# Patient Record
Sex: Female | Born: 1980 | Race: White | Hispanic: No | Marital: Married | Smoking: Never smoker
Health system: Southern US, Community
[De-identification: ages and names within clinical notes are randomized; demographics above are authoritative.]

## PROBLEM LIST (undated history)

## (undated) DIAGNOSIS — B009 Herpesviral infection, unspecified: Secondary | ICD-10-CM

## (undated) DIAGNOSIS — G43909 Migraine, unspecified, not intractable, without status migrainosus: Secondary | ICD-10-CM

## (undated) DIAGNOSIS — G40909 Epilepsy, unspecified, not intractable, without status epilepticus: Secondary | ICD-10-CM

## (undated) HISTORY — DX: Epilepsy, unspecified, not intractable, without status epilepticus: G40.909

## (undated) HISTORY — PX: NO PAST SURGERIES: SHX2092

## (undated) HISTORY — DX: Herpesviral infection, unspecified: B00.9

## (undated) HISTORY — DX: Migraine, unspecified, not intractable, without status migrainosus: G43.909

---
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
## 2001-09-12 ENCOUNTER — Other Ambulatory Visit: Admission: RE | Admit: 2001-09-12 | Discharge: 2001-09-12 | Payer: Self-pay | Admitting: Gynecology

## 2002-10-18 ENCOUNTER — Other Ambulatory Visit: Admission: RE | Admit: 2002-10-18 | Discharge: 2002-10-18 | Payer: Self-pay | Admitting: Gynecology

## 2003-10-24 ENCOUNTER — Other Ambulatory Visit: Admission: RE | Admit: 2003-10-24 | Discharge: 2003-10-24 | Payer: Self-pay | Admitting: Gynecology

## 2005-01-19 ENCOUNTER — Other Ambulatory Visit: Admission: RE | Admit: 2005-01-19 | Discharge: 2005-01-19 | Payer: Self-pay | Admitting: Gynecology

## 2006-01-30 ENCOUNTER — Other Ambulatory Visit: Admission: RE | Admit: 2006-01-30 | Discharge: 2006-01-30 | Payer: Self-pay | Admitting: Gynecology

## 2007-02-08 ENCOUNTER — Other Ambulatory Visit: Admission: RE | Admit: 2007-02-08 | Discharge: 2007-02-08 | Payer: Self-pay | Admitting: Obstetrics and Gynecology

## 2007-09-03 ENCOUNTER — Other Ambulatory Visit: Admission: RE | Admit: 2007-09-03 | Discharge: 2007-09-03 | Payer: Self-pay | Admitting: Gynecology

## 2007-12-23 ENCOUNTER — Observation Stay (HOSPITAL_COMMUNITY): Admission: EM | Admit: 2007-12-23 | Discharge: 2007-12-24 | Payer: Self-pay | Admitting: Emergency Medicine

## 2007-12-23 ENCOUNTER — Ambulatory Visit: Payer: Self-pay | Admitting: Infectious Diseases

## 2008-03-13 ENCOUNTER — Encounter: Payer: Self-pay | Admitting: Women's Health

## 2008-03-13 ENCOUNTER — Other Ambulatory Visit: Admission: RE | Admit: 2008-03-13 | Discharge: 2008-03-13 | Payer: Self-pay | Admitting: Gynecology

## 2008-03-13 ENCOUNTER — Ambulatory Visit: Payer: Self-pay | Admitting: Women's Health

## 2008-09-27 ENCOUNTER — Inpatient Hospital Stay (HOSPITAL_COMMUNITY): Admission: EM | Admit: 2008-09-27 | Discharge: 2008-09-28 | Payer: Self-pay | Admitting: Emergency Medicine

## 2009-03-18 ENCOUNTER — Other Ambulatory Visit: Admission: RE | Admit: 2009-03-18 | Discharge: 2009-03-18 | Payer: Self-pay | Admitting: Gynecology

## 2009-03-18 ENCOUNTER — Ambulatory Visit: Payer: Self-pay | Admitting: Women's Health

## 2009-12-24 ENCOUNTER — Ambulatory Visit: Payer: Self-pay | Admitting: Women's Health

## 2010-03-19 ENCOUNTER — Ambulatory Visit: Payer: Self-pay | Admitting: Women's Health

## 2010-03-19 ENCOUNTER — Other Ambulatory Visit: Admission: RE | Admit: 2010-03-19 | Discharge: 2010-03-19 | Payer: Self-pay | Admitting: Gynecology

## 2010-05-15 IMAGING — CR DG ELBOW COMPLETE 3+V*L*
4 series · 4 of 4 positions shown · non-contrast
Comparison: None

CLINICAL DATA: Seizure.  Trauma.

LEFT ELBOW - COMPLETE 3+ VIEW

[x elbow joint ap left]
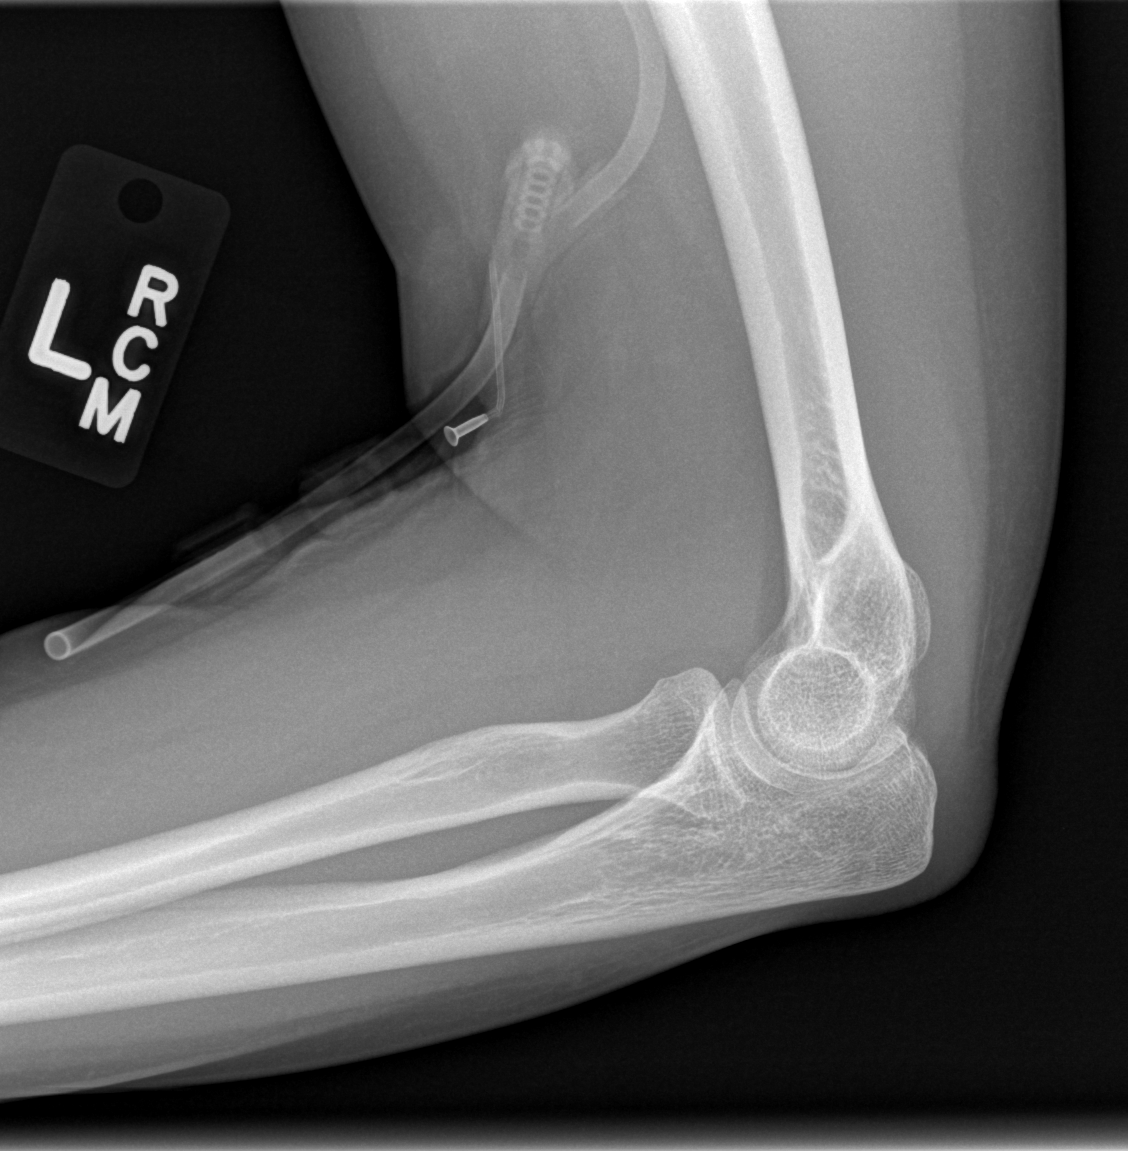

[x elbow joint obl. left (1 of 2)]
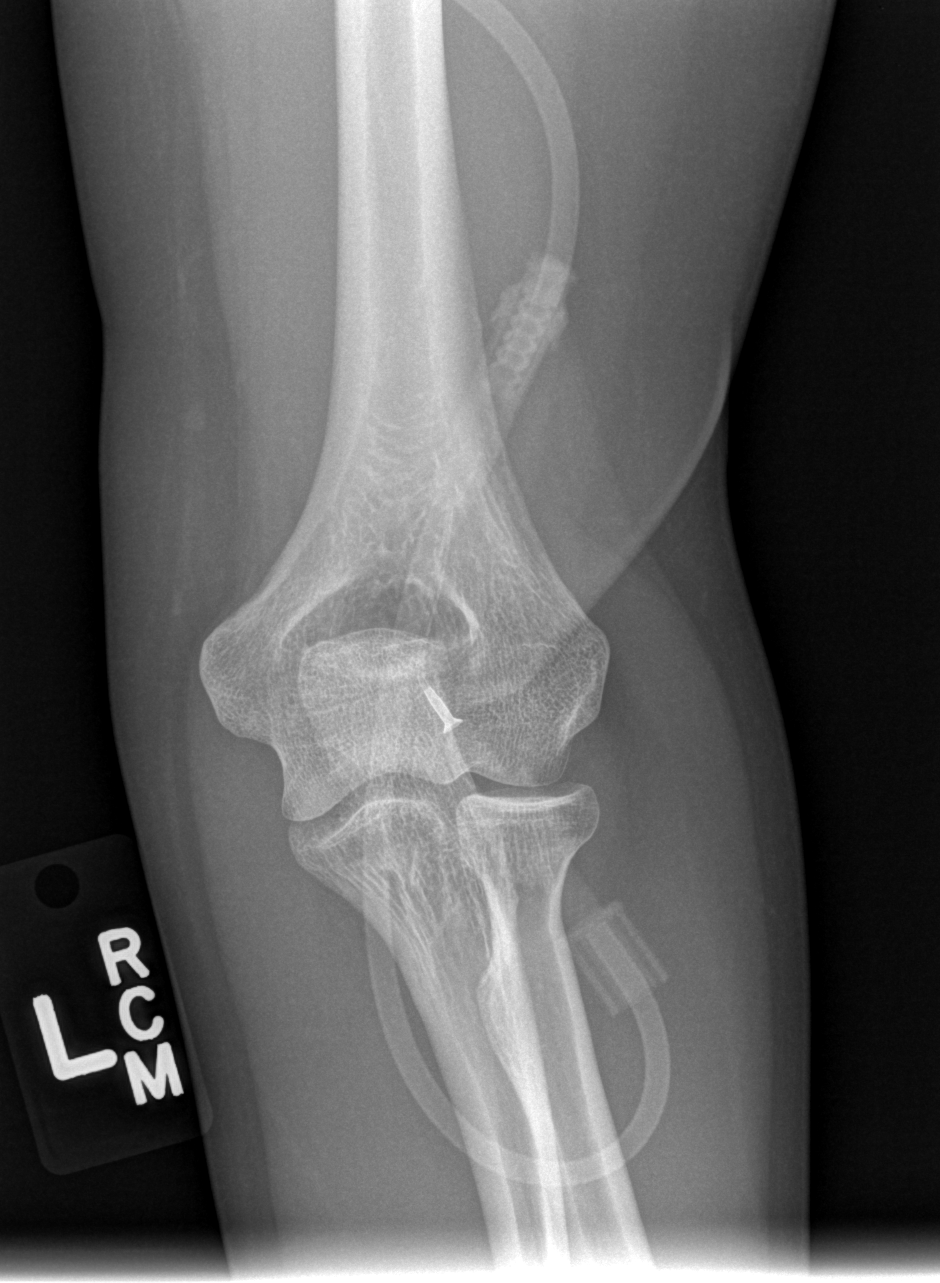

[x elbow joint obl. left (2 of 2)]
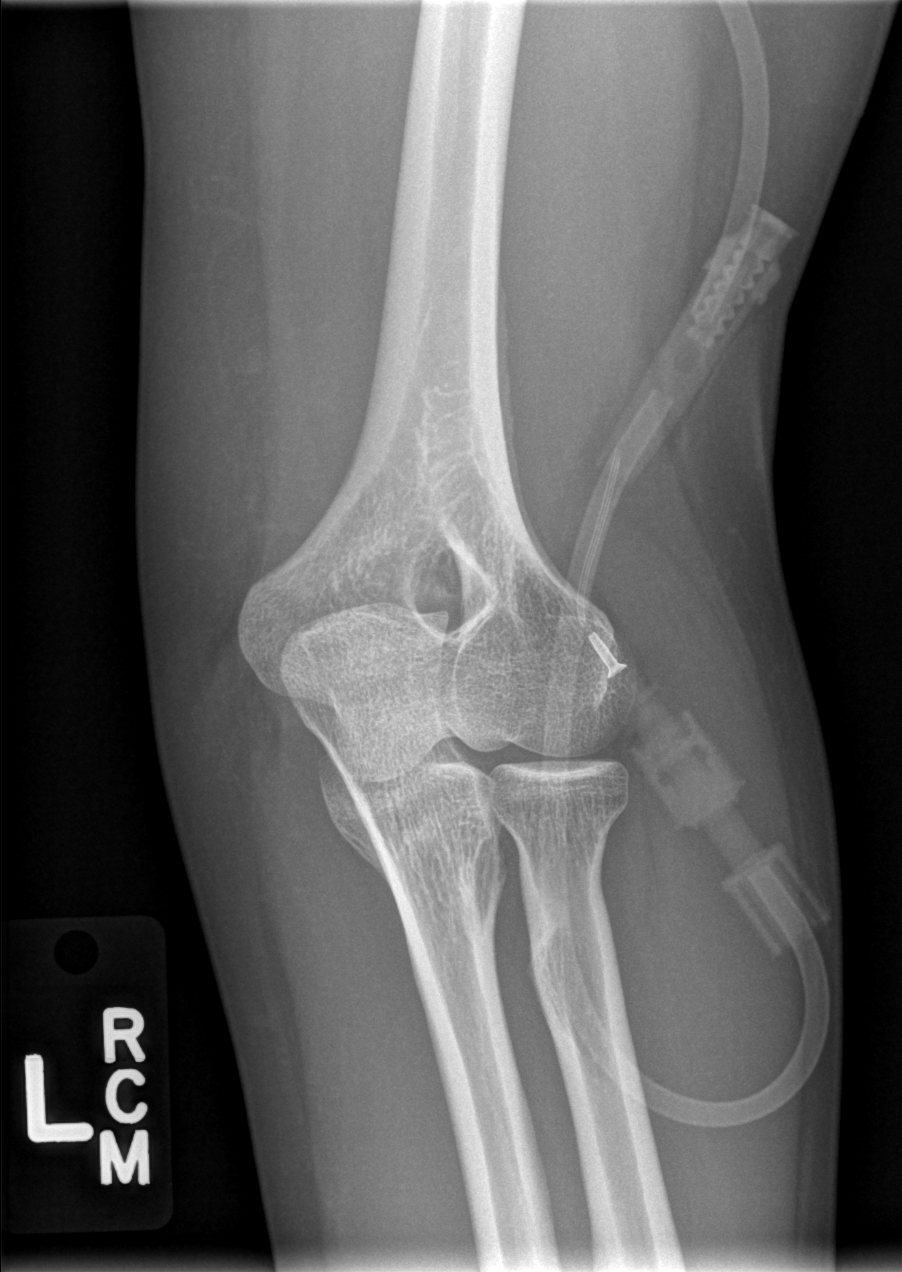

[x elbow joint lat left]
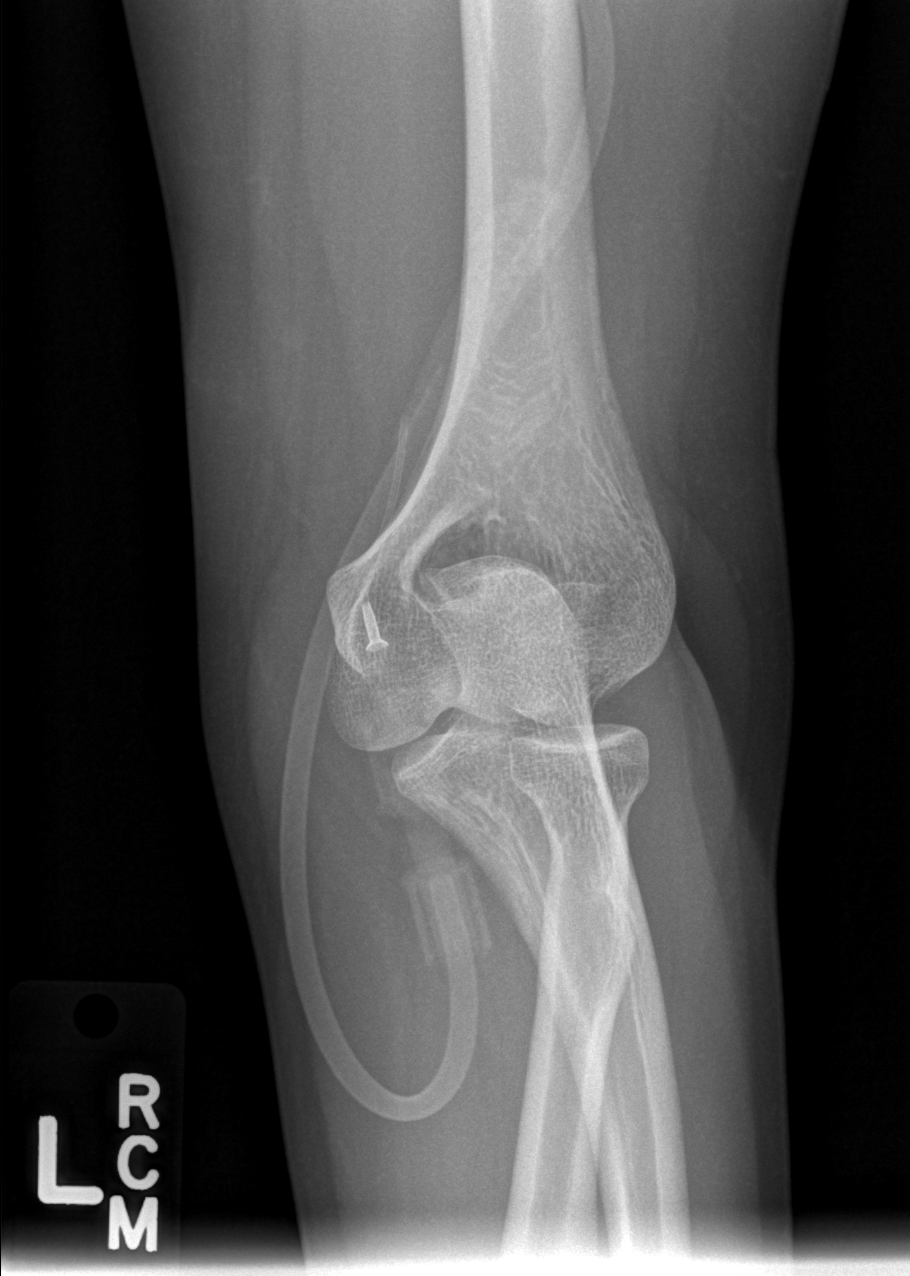

[4 of 4 positions shown; findings below may reference images not displayed]

FINDINGS: Artifact overlies the region.  No discernible joint
effusion.  No evidence of fracture or dislocation.
IMPRESSION: Negative

## 2010-06-09 ENCOUNTER — Ambulatory Visit: Payer: 59 | Admitting: Women's Health

## 2010-06-09 DIAGNOSIS — R21 Rash and other nonspecific skin eruption: Secondary | ICD-10-CM

## 2010-08-10 LAB — COMPREHENSIVE METABOLIC PANEL
Alkaline Phosphatase: 54 U/L (ref 39–117)
BUN: 8 mg/dL (ref 6–23)
CO2: 22 mEq/L (ref 19–32)
Calcium: 8.7 mg/dL (ref 8.4–10.5)
GFR calc non Af Amer: >60 mL/min (ref >60)
Glucose, Bld: 93 mg/dL (ref 70–99)
Potassium: 3.5 mEq/L (ref 3.5–5.1)
Total Protein: 6.8 g/dL (ref 6.0–8.3)

## 2010-08-10 LAB — URINALYSIS, ROUTINE W REFLEX MICROSCOPIC
Bilirubin Urine: NEGATIVE
Specific Gravity, Urine: 1.024 (ref 1.005–1.030)
Urobilinogen, UA: 0.2 mg/dL (ref 0.0–1.0)
pH: 7 (ref 5.0–8.0)

## 2010-08-10 LAB — POCT I-STAT, CHEM 8
Creatinine, Ser: 0.9 mg/dL (ref 0.4–1.2)
Glucose, Bld: 86 mg/dL (ref 70–99)
Hemoglobin: 14.3 g/dL (ref 12.0–15.0)
TCO2: 18 mmol/L (ref 0–100)

## 2010-08-10 LAB — CBC
HCT: 40.3 % (ref 36.0–46.0)
Hemoglobin: 13.8 g/dL (ref 12.0–15.0)
MCHC: 34.1 g/dL (ref 30.0–36.0)
MCV: 88.8 fL (ref 78.0–100.0)
RBC: 4.54 MIL/uL (ref 3.87–5.11)

## 2010-08-10 LAB — URINE MICROSCOPIC-ADD ON

## 2010-08-10 LAB — RAPID URINE DRUG SCREEN, HOSP PERFORMED
Amphetamines: NOT DETECTED
Barbiturates: NOT DETECTED
Opiates: NOT DETECTED

## 2010-08-13 ENCOUNTER — Other Ambulatory Visit: Payer: Self-pay | Admitting: Dermatology

## 2010-08-23 ENCOUNTER — Other Ambulatory Visit: Payer: Self-pay | Admitting: Dermatology

## 2010-09-14 NOTE — Procedures (Signed)
EEG NUMBER:   CLINICAL HISTORY:  A 30 year old female, has witnessed seizure activity  x2, following stay up late, heavy drinking, and the patient had a  history of absence seizure at age 62.   TECHNICAL COMMENT:  An 18-channel EEG was performed based on standard  international 10-20 system.  Upon awakening, the posterior background  activity was 10 Hz, reactive to eye opening and closure, symmetric.  During the drowsiness, there was burst of biosynchronized,  intermittent,  delta activity, short lasting, couple of seconds but did  not proceed, and followed by background slowing.  It has increased  appearings, during the photic stimulation, and during hyperventilation.  There was no evidence of epileptiform discharge.   The patient was drowsy during the recording but no deeper stage of sleep  was achieved.   CONCLUSION:  This is a mild abnormal EEG.  There is no evidence of  epileptiform discharge.  The synchronized, intermittent delta activity,  can be a normal variant or indicate a mild encephalopathic process.      Levert Feinstein, MD  Electronically Signed     [id number]  D:  12/24/2007 13:58:13  T:  12/25/2007 04:10:58  Job #:  [id number]

## 2010-09-14 NOTE — H&P (Signed)
NAME:  Lori Alvarado, Lori Alvarado NO.:  [id number]   MEDICAL RECORD NO.:  [id number]          PATIENT TYPE:  INP   LOCATION:  3729                         FACILITY:  [facility]   PHYSICIAN:  Noel Christmas, MD    DATE OF BIRTH:  [birth year]   DATE OF ADMISSION:  09/27/2008  DATE OF DISCHARGE:                              HISTORY & PHYSICAL   HISTORY OF PRESENT ILLNESS:  This is a 30 year old lady with a history  of generalized seizure disorder, presenting with history of 2 grand mal  seizures today which were witnessed.  First seizure occurred at home.  The patient fell and suffered bump to the back of her head as well as  mild injury to elbow.  Second seizure occurred while in the emergency  room.  She has been on Topamax 100 mg at bedtime.  Her last seizure  activity was in August 2009.  Seizure activity occurred after night of  consuming larger than usual amount of alcohol as well as lack of  adequate sleep.  She presented at this time for similar circumstances  having drank at least moderate if not large amount of alcohol last night  and having only 4-5 hours of sleep.  Her CT scan showed no intracranial  abnormality.  CBC was normal.  She was afebrile.  Complete metabolic  panel and drug screens are pending at this point.  Pregnancy test was  negative.   PAST MEDICAL HISTORY:  Remarkable for generalized seizures over the past  2 years.  She has a remote history of absence seizures during childhood.  She was seizure free for about 16 years.  Medical history is otherwise  remarkable for migraine headaches.   MEDICATIONS:  1. Topamax 100 mg at bedtime.  2. Birth control pills.   FAMILY HISTORY:  Positive for epilepsy involving a maternal aunt.  Family history is otherwise noncontributory.   ALLERGIES:  No known drug allergies.   SOCIAL HISTORY:  The patient is single.  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  She drinks beer mostly on  weekends.  She is a nonsmoker.   REVIEW OF SYSTEMS:  Negative except for presenting problem.   PHYSICAL EXAMINATION:  VITAL SIGNS:  Temperature was 98.6, pulse 86 per  minute, respirations 16 per minute, blood pressure was 125/79, and  oxygen saturation was 100% on room air.  GENERAL:  The appearance was that of a young lady of medium built who  was alert, cooperative and in no acute distress.  Except for slight  disorientation to day and date, her mental status was normal.  HEAD, EYES, EARS, NOSE, AND THROAT:  Normal.  NECK:  Supple and nontender.  CHEST:  Clear to auscultation.  HEART:  Heart sounds were normal.  Rate and rhythm were normal as well.  ABDOMEN:  Soft and flat.  Bowel sounds were normal.  There was no  tenderness.  EXTREMITIES:  Normal including peripheral pulses.  NEUROLOGIC:  Pupils were equal and reactive normally to light.  Extraocular movements and visual fields were normal.  There was no  facial weakness.  Hearing was normal.  Speech and palatal movement were  normal.  Strength and muscle tone were normal throughout.  Deep tendon  reflexes were normal and symmetrical.  Plantar responses were flexor.  Sensory examination was normal.  Coordination was normal.  Carotid  auscultation was normal.   CLINICAL IMPRESSION:  1. Recurrent grand mal seizures probably to at least in part to sleep      deprivation as well as alcohol consumption and possible withdrawal.  2. 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   PLAN:  1. IV Keppra 500 mg loading dose.  2. Keppra 500 mg p.o. q.12 h. over the next 24 hours.  3. We will increase Topamax to 100 mg q.a.m. and at bedtime.  4. No driving of motor vehicle until cleared by Dr. Anne Hahn.  5. Follow up with Dr. Anne Hahn after discharge.  6. No alcohol consumption except in very small amounts.  7. Minimum 7-8 hours of sleep each night.      Noel Christmas, MD  Electronically Signed     CS/MEDQ  D:  09/27/2008  T:  09/28/2008   Job:  [id number]

## 2010-09-14 NOTE — Discharge Summary (Signed)
NAMECOLEEN, CARDIFF NO.:  [id number]   MEDICAL RECORD NO.:  [id number]          PATIENT TYPE:  INP   LOCATION:  3729                         FACILITY:  [facility]   PHYSICIAN:  Noel Christmas, MD    DATE OF BIRTH:  19-Sep-1980   DATE OF ADMISSION:  09/27/2008  DATE OF DISCHARGE:  09/28/2008                               DISCHARGE SUMMARY   DISCHARGE DIAGNOSES:  1. Recurrent generalized seizure activity.  2. Chronic generalized seizure disorder.  3. Migraine headaches.   HISTORY OF PRESENT ILLNESS:  This is a 30 year old lady who was admitted  yesterday following 2 grand mal seizures, the first of which occurred at  home and the second occurred in the emergency room.  She had been on  Topamax 100 mg at bedtime.  The patient had drunk a larger than usual  amount of alcohol on the previous night as well as not gotten usual  amount of sleep and was sleep deprived.  Both seizures were witnessed.  CT scan of her head was unremarkable.  Electrolytes were unremarkable.  Drug screen was negative.  The patient was admitted on August 2009, with  a very similar presentation of having drunk larger than usual amount of  alcohol for her as well as having not slept well on previous night and  experienced 2 seizures.   HOSPITAL COURSE:  The patient was given a loading dose of Keppra 500 mg  IV followed by maintenance dose of Keppra 500 mg q.12 h.  Topamax was  increased to 100 mg q.a.m. and at bedtime.  The patient had no recurrent  seizures.  At the time of discharge, he was awake and alert and without  complaints.  She did not experience migraine headache at any point  during hospital stay.  Hospital course was otherwise unremarkable.   DISCHARGE RECOMMENDATIONS:  1. Continue Topamax 100 mg q.a.m. and at bedtime.  2. Keppra was discontinued at the time of discharge.  3. Keep followup appointment with Dr. Anne Hahn on October 21, 2008.  4. No driving of motor vehicle for at least 90  days and not until      cleared by Dr. Anne Hahn.      Noel Christmas, MD  Electronically Signed     CS/MEDQ  D:  09/28/2008  T:  09/29/2008  Job:  [id number]   cc:   Marlan Palau, M.D.

## 2010-09-14 NOTE — Consult Note (Signed)
NAME:  Lori Alvarado, Lori Alvarado NO.:  [id number]   MEDICAL RECORD NO.:  [id number]          PATIENT TYPE:  INP   LOCATION:  4728                         FACILITY:  [facility]   PHYSICIAN:  Marlan Palau, M.D.  DATE OF BIRTH:  16-Jul-1980   DATE OF CONSULTATION:  12/23/2007  DATE OF DISCHARGE:                                 CONSULTATION   HISTORY OF PRESENT ILLNESS:  Lori Alvarado is a 30 year old right-  handed white female born on 05/06/1980 with a history of absence  seizures treated by Dr. Sharene Skeans in the past.  The patient has been off  all seizure medications for at least 16 years and has done well.  The  patient was on Neurontin previously.  The patient had in the last 8-10  months been treated by Dr. Clarisse Gouge for migraine headaches and was on  Topamax for short period time and has been off these medications for  about 8 months.  The patient has done quite well but was at the golf  tournament yesterday drinking heavily until about 1-2 in the morning.  The patient got up early to go to church today and then was noted after  church to have an event of sudden onset of generalized jerking and  tongue biting.  The patient was brought to the emergency room for an  evaluation and suffered a second event.  The patient had no warning  prior to either one of the above events and has no recollection of the  events.  The patient has undergone a CT scan of the head that is  unremarkable.  The patient continues to have alcohol in her blood stream  with a level of 12.  Urine drug screen was negative.   PAST MEDICAL HISTORY:  1. History of the generalized tonic-clonic seizures today x2.  2. Absence seizures as a child.  3. History of migraine headache.   MEDICATIONS:  The patient has been on no medications prior to admission.   ALLERGIES:  No known allergies.   HABITS:  Does not smoke.  Drinks alcohol on occasion.   SOCIAL HISTORY:  This patient is single, lives in the  [place] area.  Works in Engineering geologist.  The patient has no children.   FAMILY MEDICAL HISTORY:  Mother and father are both alive and well.  The  patient has 2 sisters and 1 brother, all alive and well.  No history of  seizures in the immediate family, but a maternal aunt does have seizures  as a generalized nature.   REVIEW OF SYSTEMS:  Notable for no recent fevers or chills.  The patient  denies headache, neck pain, shortness of breath, chest pains, abdominal  pain, troubles controlling bowels or bladder.  Denies any focal numbness  or weakness on the face, arms, legs, or dizziness.   PHYSICAL EXAMINATION:  VITAL SIGNS:  Blood pressure is 117/62, heart  rate 95, respiratory rate 20, and temperature afebrile.  GENERAL:  The patient is a fairly well-developed white female who is  alert and cooperative at the time of examination.  HEENT:  Head:  Atraumatic.  Eyes:  Pupils equal, round, and reactive to  light.  Disks are flat bilaterally.  NECK:  Supple.  No carotid bruits noted.  Small laceration in the left  lateral aspect of the tongue is noted.  EXTREMITIES:  Without significant edema.  NEUROLOGIC:  Cranial nerves as above.  Facial symmetry is present.  The  patient has good sensation of face to pinprick and soft touch  bilaterally.  He has good strength to facial muscle, muscles of head and  trunk, and shoulder shrug bilaterally.  Speech is well enunciated, not  aphasic.  Motor testing reveals 5/5 strength in all fours.  Good  symmetric motor is noted throughout.  Sensory testing is intact to  pinprick, soft touch, and vibratory sensation throughout.  The patient  has good finger-nose-finger and total finger bilaterally.  Gait was not  tested.  Deep tendon reflexes remained symmetric and normal.  No drift  was seen in the upper extremities.  Toes were downgoing bilaterally.   LABORATORY VALUES:  Notable for sodium 142, potassium 3.9, chloride 108,  BUN of 7,  creatinine 1.3, glucose of 78, and ionized calcium of 1.15.  Hemoglobin of 14.3, hematocrit 42.0, white count of 7.6, MCV of 87.2,  platelets of 196, and alcohol level of 12.  Urine pregnancy test  negative.  Urinalysis reveals specific gravity of 0.017, pH 6.5,  otherwise unremarkable.  Urine drug screen is negative.  CT of the head  is as above.   IMPRESSION:  History of absence seizure with recent generalized tonic-  clonic seizures today.   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  The patient had partially provoked seizure x2  today due to sleep deprivation and heavy alcohol use.  At this point,  the patient is not to drive for at least 90 days.  The patient will be  placed back on Topamax beginning 50 mg at night for 2 weeks and go to  100 mg at night.  The patient will have an EEG study during this  admission.  We will follow up with [facility]  following seizure precautions during this admission.      Marlan Palau, M.D.  Electronically Signed     CKW/MEDQ  D:  12/23/2007  T:  12/24/2007  Job:  [id number]   cc:   [facility]
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
## 2010-09-15 ENCOUNTER — Other Ambulatory Visit: Payer: Self-pay | Admitting: Dermatology

## 2011-03-16 DIAGNOSIS — B009 Herpesviral infection, unspecified: Secondary | ICD-10-CM | POA: Insufficient documentation

## 2011-03-16 DIAGNOSIS — G40909 Epilepsy, unspecified, not intractable, without status epilepticus: Secondary | ICD-10-CM | POA: Insufficient documentation

## 2011-03-16 DIAGNOSIS — D4701 Cutaneous mastocytosis: Secondary | ICD-10-CM | POA: Insufficient documentation

## 2011-03-21 ENCOUNTER — Encounter: Payer: Self-pay | Admitting: Women's Health

## 2011-03-21 ENCOUNTER — Ambulatory Visit (INDEPENDENT_AMBULATORY_CARE_PROVIDER_SITE_OTHER): Payer: 59 | Admitting: Women's Health

## 2011-03-21 ENCOUNTER — Other Ambulatory Visit (HOSPITAL_COMMUNITY)
Admission: RE | Admit: 2011-03-21 | Discharge: 2011-03-21 | Disposition: A | Discharge status: Home / Self Care | Payer: 59 | Source: Ambulatory Visit | Attending: Obstetrics and Gynecology | Admitting: Obstetrics and Gynecology

## 2011-03-21 VITALS — BP 110/60 | Ht 62.0 in | Wt 133.0 lb

## 2011-03-21 DIAGNOSIS — Z01419 Encounter for gynecological examination (general) (routine) without abnormal findings: Secondary | ICD-10-CM

## 2011-03-21 DIAGNOSIS — Z124 Encounter for screening for malignant neoplasm of cervix: Secondary | ICD-10-CM | POA: Insufficient documentation

## 2011-03-21 DIAGNOSIS — B009 Herpesviral infection, unspecified: Secondary | ICD-10-CM

## 2011-03-21 DIAGNOSIS — IMO0001 Reserved for inherently not codable concepts without codable children: Secondary | ICD-10-CM

## 2011-03-21 DIAGNOSIS — Z309 Encounter for contraceptive management, unspecified: Secondary | ICD-10-CM

## 2011-03-21 MED ORDER — VALACYCLOVIR HCL 500 MG PO TABS: 500.0000 mg | ORAL_TABLET | Freq: Two times a day (BID) | ORAL | Status: AC

## 2011-03-21 MED ORDER — NORGESTIM-ETH ESTRAD TRIPHASIC 0.18/0.215/0.25 MG-35 MCG PO TABS: 1.0000 | ORAL_TABLET | Freq: Every day | ORAL | Status: DC

## 2011-03-21 NOTE — Progress Notes (Signed)
Lori Alvarado 08-Apr-1981 [id number]    History:    The patient presents for annual exam.  Works for [employer].  Past medical history, past surgical history, family history and social history were all reviewed and documented in the EPIC chart.   ROS:  A  ROS was performed and pertinent positives and negatives are included in the history.  Exam:  Filed Vitals:   03/21/11 0854  BP: 110/60    General appearance:  Normal Head/Neck:  Normal, without cervical or supraclavicular adenopathy. Thyroid:  Symmetrical, normal in size, without palpable masses or nodularity. Respiratory  Effort:  Normal  Auscultation:  Clear without wheezing or rhonchi Cardiovascular  Auscultation:  Regular rate, without rubs, murmurs or gallops  Edema/varicosities:  Not grossly evident Abdominal  Soft,nontender, without masses, guarding or rebound.  Liver/spleen:  No organomegaly noted  Hernia:  None appreciated  Skin  Inspection:  Grossly normal  Palpation:  Grossly normal Neurologic/psychiatric  Orientation:  Normal with appropriate conversation.  Mood/affect:  Normal  Genitourinary    Breasts: Examined lying and sitting.     Right: Without masses, retractions, discharge or axillary adenopathy.     Left: Without masses, retractions, discharge or axillary adenopathy.   Inguinal/mons:  Normal without inguinal adenopathy  External genitalia:  Normal  BUS/Urethra/Skene's glands:  Normal  Bladder:  Normal  Vagina:  Normal  Cervix:  Normal  Uterus:   normal in size, shape and contour.  Midline and mobile  Adnexa/parametria:     Rt: Without masses or tenderness.   Lt: Without masses or tenderness.  Anus and perineum: Normal  Digital rectal exam: Normal sphincter tone without palpated masses or tenderness  Assessment/Plan:  30 y.o. MWF G0 for annual exam with no complaints. Monthly 3 day cycle on Ortho Tri-Cyclen. History of a seizure disorder on Keppra and has been seizure-free. Gardasil  completed in 07, history of all normal Paps. Rubella immune.  Normal GYN exam Seizure disorder/stable on Keppra  Plan: Recommended Tdap Vaccine. Ortho Tri-Cyclen prescription, proper use, slight risk for blood clots and strokes was reviewed. SBEs, exercise, calcium rich diet, MVI daily encouraged.Not planning a pregnancy for another year or 2.  Pap only, had normal labs at dermatologist and neurologist.    Harrington Challenger [facility], 10:30 AM 03/21/2011

## 2011-05-25 ENCOUNTER — Telehealth: Payer: Self-pay

## 2011-05-25 NOTE — Telephone Encounter (Signed)
error 

## 2012-05-02 NOTE — L&D Delivery Note (Signed)
Operative Delivery Note At 12:38 PM a viable and healthy female was delivered via Vaginal, Vacuum [employer], operational).  Presentation: vertex; Position: Occiput,, Anterior; Station: +3.  Verbal consent: obtained from patient.  Risks and benefits discussed in detail.  Risks include, but are not limited to the risks of anesthesia, bleeding, infection, damage to maternal tissues, fetal cephalhematoma.  There is also the risk of inability to effect vaginal delivery of the head, or shoulder dystocia that cannot be resolved by established maneuvers, leading to the need for emergency cesarean section.  APGAR: 7, 9; weight .  8lb Placenta status: Intact, Spontaneous.  Not sent Cord: 3 vessels with the following complications: None.  Cord pH: none  Anesthesia: Epidural  Instruments: Kiwi Episiotomy: none Lacerations: 2nd degree;Sulcus;Perineal;Vaginal Suture Repair: 3.0 chromic Est. Blood Loss (mL): 350  Mom to postpartum.  Baby to nursery-stable.  Deaira Leckey A 02/13/2013, 1:05 PM

## 2012-05-03 ENCOUNTER — Ambulatory Visit (INDEPENDENT_AMBULATORY_CARE_PROVIDER_SITE_OTHER): Payer: 59 | Admitting: Women's Health

## 2012-05-03 ENCOUNTER — Encounter: Payer: Self-pay | Admitting: Women's Health

## 2012-05-03 VITALS — BP 120/74 | Ht 61.75 in | Wt 131.5 lb

## 2012-05-03 DIAGNOSIS — B009 Herpesviral infection, unspecified: Secondary | ICD-10-CM

## 2012-05-03 DIAGNOSIS — Z01419 Encounter for gynecological examination (general) (routine) without abnormal findings: Secondary | ICD-10-CM

## 2012-05-03 LAB — CBC WITH DIFFERENTIAL/PLATELET
Basophils Absolute: 0.1 10*3/uL (ref 0.0–0.1)
Eosinophils Relative: 1 % (ref 0–5)
Lymphocytes Relative: 36 % (ref 12–46)
Lymphs Abs: 1.9 10*3/uL (ref 0.7–4.0)
MCV: 86 fL (ref 78.0–100.0)
Neutro Abs: 2.8 10*3/uL (ref 1.7–7.7)
Platelets: 140 10*3/uL — ABNORMAL LOW (ref 150–400)
RBC: 4.64 MIL/uL (ref 3.87–5.11)
WBC: 5.2 10*3/uL (ref 4.0–10.5)

## 2012-05-03 MED ORDER — VALACYCLOVIR HCL 500 MG PO TABS: ORAL_TABLET | ORAL | Status: DC

## 2012-05-03 NOTE — Patient Instructions (Addendum)

## 2012-05-03 NOTE — Progress Notes (Signed)
Lori Alvarado March 03, 1981 [id number]    History:    The patient presents for annual exam.  Regular monthly 3-4 day cycle. Stopped using Ortho Tri-Cyclen  this past year and has started to actively try to conceive in December. History of seizures on Keppra per neurologist/seizure-free. History of normal Paps. Gardasil series completed 2007. Rubella positive. History of HSV 1 uses Valtrex as needed.   Past medical history, past surgical history, family history and social history were all reviewed and documented in the EPIC chart. Works at [employer] in [place] wear/some travel with job.   ROS:  A  ROS was performed and pertinent positives and negatives are included in the history.  Exam:  Filed Vitals:   05/03/12 1141  BP: 120/74    General appearance:  Normal Head/Neck:  Normal, without cervical or supraclavicular adenopathy. Thyroid:  Symmetrical, normal in size, without palpable masses or nodularity. Respiratory  Effort:  Normal  Auscultation:  Clear without wheezing or rhonchi Cardiovascular  Auscultation:  Regular rate, without rubs, murmurs or gallops  Edema/varicosities:  Not grossly evident Abdominal  Soft,nontender, without masses, guarding or rebound.  Liver/spleen:  No organomegaly noted  Hernia:  None appreciated  Skin  Inspection:  Grossly normal  Palpation:  Grossly normal Neurologic/psychiatric  Orientation:  Normal with appropriate conversation.  Mood/affect:  Normal  Genitourinary    Breasts: Examined lying and sitting.     Right: Without masses, retractions, discharge or axillary adenopathy.     Left: Without masses, retractions, discharge or axillary adenopathy.   Inguinal/mons:  Normal without inguinal adenopathy  External genitalia:  Normal  BUS/Urethra/Skene's glands:  Normal  Bladder:  Normal  Vagina:  Normal  Cervix:  Normal  Uterus:   normal in size, shape and contour.  Midline and mobile  Adnexa/parametria:     Rt: Without masses or  tenderness.   Lt: Without masses or tenderness.  Anus and perineum: Normal  Digital rectal exam: Normal sphincter tone without palpated masses or tenderness  Assessment/Plan:  32 y.o. M. WF G0 for annual exam with no complaints.  Normal GYN exam Seizure disorder/seizure-free on Keppra per [place]  Neurological HSV 1  Plan: Continue folic acid and prenatal vitamins daily. Return to office with missed cycle for viability ultrasound, aware we no longer deliver. Healthy pregnancy behaviors reviewed. Continue healthy diet and active lifestyle. SBE's encouraged. Valtrex 500 twice daily as needed prescription, proper use given and reviewed. CBC, UA, Pap normal 2012, new screening guidelines reviewed.    Harrington Challenger [facility], 12:21 PM 05/03/2012

## 2012-05-04 ENCOUNTER — Other Ambulatory Visit: Payer: Self-pay | Admitting: Women's Health

## 2012-05-04 DIAGNOSIS — D696 Thrombocytopenia, unspecified: Secondary | ICD-10-CM

## 2012-05-04 LAB — URINALYSIS W MICROSCOPIC + REFLEX CULTURE
Bacteria, UA: NONE SEEN
Bilirubin Urine: NEGATIVE
Casts: NONE SEEN
Crystals: NONE SEEN
Glucose, UA: NEGATIVE mg/dL
Hgb urine dipstick: NEGATIVE
Ketones, ur: NEGATIVE mg/dL
Leukocytes, UA: NEGATIVE
Nitrite: NEGATIVE
Protein, ur: NEGATIVE mg/dL
Specific Gravity, Urine: <1.005 — ABNORMAL LOW (ref 1.005–1.030)
Squamous Epithelial / HPF: NONE SEEN
Urobilinogen, UA: 0.2 mg/dL (ref 0.0–1.0)
pH: 6 (ref 5.0–8.0)

## 2012-06-07 ENCOUNTER — Ambulatory Visit (INDEPENDENT_AMBULATORY_CARE_PROVIDER_SITE_OTHER): Payer: 59 | Admitting: Women's Health

## 2012-06-07 ENCOUNTER — Telehealth: Payer: Self-pay | Admitting: Women's Health

## 2012-06-07 DIAGNOSIS — N912 Amenorrhea, unspecified: Secondary | ICD-10-CM

## 2012-06-07 NOTE — Progress Notes (Signed)
Patient ID: Lori Alvarado, female   DOB: 05-22-1980, 32 y.o.   MRN: [id number] LMP 05/06/12, denies cramping, bleeding, discharge or nausea. Instructed to return to office for viability/dating ultrasound after February 21. Aware were no longer deliver, currently taking a prenatal vitamin daily.

## 2012-06-07 NOTE — Telephone Encounter (Signed)
Telephone call, first day of last cycle January 5, positive home U PT. Iinstructed to schedule viability/dating ultrasound between February 18 and 25th. Denies bleeding, discharge, abdominal pain, currently on a prenatal vitamin daily.

## 2012-06-22 ENCOUNTER — Encounter: Payer: Self-pay | Admitting: Women's Health

## 2012-06-22 ENCOUNTER — Ambulatory Visit (INDEPENDENT_AMBULATORY_CARE_PROVIDER_SITE_OTHER): Payer: 59

## 2012-06-22 ENCOUNTER — Ambulatory Visit (INDEPENDENT_AMBULATORY_CARE_PROVIDER_SITE_OTHER): Payer: 59 | Admitting: Women's Health

## 2012-06-22 DIAGNOSIS — N912 Amenorrhea, unspecified: Secondary | ICD-10-CM

## 2012-06-22 DIAGNOSIS — O3680X0 Pregnancy with inconclusive fetal viability, not applicable or unspecified: Secondary | ICD-10-CM

## 2012-06-22 LAB — US OB TRANSVAGINAL

## 2012-06-22 NOTE — Patient Instructions (Addendum)

## 2012-06-22 NOTE — Progress Notes (Signed)
Patient ID: Lori Alvarado, female   DOB: [birth year], 32 y.o.   MRN: [id number] Presents for viability ultrasound. Without complaint. No bleeding, cramping, abdominal pain. Prenatal vitamin daily and Keppra for seizure disorder.  Ultrasound: IUP with yolk sac. Fetal pole seen with fetal heart tone. Left ovary normal. Right ovarian echo-free thin-walled avascular 91 x 61 x 58 mm. No free fluid seen.  6 week 4 day IUP  Plan: Reviewed left ovarian cyst/probable corpus luteal larger than usual, copy of ultrasound given to take to first prenatal appointment,  instructed to schedule. Healthy pregnancy behaviors reviewed, aware. Congratulations given.

## 2012-06-25 ENCOUNTER — Telehealth: Payer: Self-pay | Admitting: Women's Health

## 2012-06-25 DIAGNOSIS — Z0000 Encounter for general adult medical examination without abnormal findings: Secondary | ICD-10-CM

## 2012-06-25 NOTE — Telephone Encounter (Signed)
Telephone call to discuss 9cm probable corpus luteal cyst. Reviewed ultrasound with Dr. Lily Peer. Will check Quant this week and repeat in 2 days. New OB appointment is scheduled end of March. Also check lipid panel and glucose for health screening for work.

## 2012-06-29 ENCOUNTER — Other Ambulatory Visit: Payer: 59

## 2012-06-29 DIAGNOSIS — N912 Amenorrhea, unspecified: Secondary | ICD-10-CM

## 2012-06-29 DIAGNOSIS — Z0000 Encounter for general adult medical examination without abnormal findings: Secondary | ICD-10-CM

## 2012-06-29 LAB — LIPID PANEL: HDL: 56 mg/dL (ref >39)

## 2012-06-29 LAB — GLUCOSE, RANDOM: Glucose, Bld: 76 mg/dL (ref 70–99)

## 2012-06-29 LAB — HCG, QUANTITATIVE, PREGNANCY: hCG, Beta Chain, Quant, S: 161818.4 m[IU]/mL

## 2012-07-10 LAB — OB RESULTS CONSOLE ABO/RH: RH Type: POSITIVE

## 2012-07-10 LAB — OB RESULTS CONSOLE ANTIBODY SCREEN: Antibody Screen: NEGATIVE

## 2012-07-23 LAB — OB RESULTS CONSOLE GC/CHLAMYDIA: Gonorrhea: NEGATIVE

## 2012-08-23 ENCOUNTER — Other Ambulatory Visit: Payer: Self-pay | Admitting: Dermatology

## 2012-11-19 LAB — OB RESULTS CONSOLE RPR: RPR: NONREACTIVE

## 2012-11-19 LAB — OB RESULTS CONSOLE HIV ANTIBODY (ROUTINE TESTING): HIV: NONREACTIVE

## 2012-12-06 ENCOUNTER — Telehealth: Payer: Self-pay | Admitting: Neurology

## 2012-12-11 ENCOUNTER — Telehealth: Payer: Self-pay

## 2012-12-11 MED ORDER — LEVETIRACETAM 750 MG PO TABS: 750.0000 mg | ORAL_TABLET | Freq: Two times a day (BID) | ORAL | Status: DC

## 2012-12-11 NOTE — Telephone Encounter (Signed)
I called patient. The patient is doing well with her seizures, currently on Keppra 500 mg twice daily. The patient has gained 40 pounds with the pregnancy, and it may be reasonable to go to 750 mg twice daily at this time until the patient delivers in October 2014.

## 2012-12-11 NOTE — Telephone Encounter (Signed)
Patient last seen 03-22-12.  She is pregnant and currently taking Keppra 500mg  twice a day.  Patient has gained 40 lbs and would like to know if she should increase her dose.  [phone]

## 2012-12-11 NOTE — Telephone Encounter (Signed)
Message copied by [facility] on Tue Dec 11, 2012  2:37 PM ------      Message from: Seth Bake      Created: Mon Dec 10, 2012  4:45 PM      Contact: patient       Called last week.  Has questions about her Keppra dose.  Please call.  ------

## 2012-12-11 NOTE — Telephone Encounter (Signed)
I returned patient's call. Dr. Anne Hahn had spoken to her last week about questions she had regarding her medications. I left a VM to her that I saw that when Dr. Anne Hahn spoke to her, she was taking 500 mg Keppra twice a day. Dr. Anne Hahn recommended that she increase that to 750 mg twice a day while pregnant. I hope this answers her question, if she was calling for clarification regarding dosing. If it did not, please call back and ask that a message be sent directly to me so I can follow up further.

## 2013-01-09 LAB — OB RESULTS CONSOLE GBS: GBS: NEGATIVE

## 2013-02-12 ENCOUNTER — Other Ambulatory Visit: Payer: Self-pay | Admitting: Obstetrics and Gynecology

## 2013-02-12 ENCOUNTER — Encounter (HOSPITAL_COMMUNITY): Payer: Self-pay | Admitting: *Deleted

## 2013-02-12 ENCOUNTER — Inpatient Hospital Stay (HOSPITAL_COMMUNITY)
Admission: AD | Admit: 2013-02-12 | Discharge: 2013-02-15 | DRG: 775 | Disposition: A | Discharge status: Home / Self Care | Payer: 59 | Source: Ambulatory Visit | Attending: Obstetrics and Gynecology | Admitting: Obstetrics and Gynecology

## 2013-02-12 DIAGNOSIS — G40909 Epilepsy, unspecified, not intractable, without status epilepticus: Secondary | ICD-10-CM | POA: Diagnosis present

## 2013-02-12 DIAGNOSIS — O99354 Diseases of the nervous system complicating childbirth: Secondary | ICD-10-CM | POA: Diagnosis present

## 2013-02-12 DIAGNOSIS — O480 Post-term pregnancy: Principal | ICD-10-CM | POA: Diagnosis present

## 2013-02-12 LAB — CBC
Hemoglobin: 13.6 g/dL (ref 12.0–15.0)
MCH: 29.3 pg (ref 26.0–34.0)
MCHC: 34.3 g/dL (ref 30.0–36.0)
Platelets: 172 10*3/uL (ref 150–400)
RBC: 4.64 MIL/uL (ref 3.87–5.11)
RDW: 13.8 % (ref 11.5–15.5)
WBC: 11.4 10*3/uL — ABNORMAL HIGH (ref 4.0–10.5)

## 2013-02-12 MED ORDER — EPHEDRINE 5 MG/ML INJ
10.0000 mg | INTRAVENOUS | Status: DC | PRN
  Filled 2013-02-12: qty 4

## 2013-02-12 MED ORDER — ONDANSETRON HCL 4 MG/2ML IJ SOLN: 4.0000 mg | Freq: Four times a day (QID) | INTRAMUSCULAR | Status: DC | PRN

## 2013-02-12 MED ORDER — OXYCODONE-ACETAMINOPHEN 5-325 MG PO TABS: 1.0000 | ORAL_TABLET | ORAL | Status: DC | PRN

## 2013-02-12 MED ORDER — LIDOCAINE HCL (PF) 1 % IJ SOLN
30.0000 mL | INTRAMUSCULAR | Status: AC | PRN
  Administered 2013-02-13: 30 mL via SUBCUTANEOUS
  Filled 2013-02-12 (×2): qty 30

## 2013-02-12 MED ORDER — TERBUTALINE SULFATE 1 MG/ML IJ SOLN: 0.2500 mg | Freq: Once | INTRAMUSCULAR | Status: AC | PRN

## 2013-02-12 MED ORDER — LACTATED RINGERS IV SOLN
INTRAVENOUS | Status: DC
  Administered 2013-02-12 – 2013-02-13 (×2): via INTRAVENOUS

## 2013-02-12 MED ORDER — BUTORPHANOL TARTRATE 1 MG/ML IJ SOLN
1.0000 mg | INTRAMUSCULAR | Status: DC | PRN
  Administered 2013-02-13: 1 mg via INTRAVENOUS
  Filled 2013-02-12: qty 1

## 2013-02-12 MED ORDER — PHENYLEPHRINE 40 MCG/ML (10ML) SYRINGE FOR IV PUSH (FOR BLOOD PRESSURE SUPPORT): 80.0000 ug | PREFILLED_SYRINGE | INTRAVENOUS | Status: DC | PRN

## 2013-02-12 MED ORDER — PHENYLEPHRINE 40 MCG/ML (10ML) SYRINGE FOR IV PUSH (FOR BLOOD PRESSURE SUPPORT)
80.0000 ug | PREFILLED_SYRINGE | INTRAVENOUS | Status: DC | PRN
  Filled 2013-02-12: qty 5

## 2013-02-12 MED ORDER — IBUPROFEN 600 MG PO TABS: 600.0000 mg | ORAL_TABLET | Freq: Four times a day (QID) | ORAL | Status: DC | PRN

## 2013-02-12 MED ORDER — ACETAMINOPHEN 325 MG PO TABS: 650.0000 mg | ORAL_TABLET | ORAL | Status: DC | PRN

## 2013-02-12 MED ORDER — FLEET ENEMA 7-19 GM/118ML RE ENEM: 1.0000 | ENEMA | RECTAL | Status: DC | PRN

## 2013-02-12 MED ORDER — OXYTOCIN 40 UNITS IN LACTATED RINGERS INFUSION - SIMPLE MED
62.5000 mL/h | INTRAVENOUS | Status: DC
  Administered 2013-02-13: 62.5 mL/h via INTRAVENOUS
  Administered 2013-02-13: 999 mL/h via INTRAVENOUS
  Filled 2013-02-12: qty 1000

## 2013-02-12 MED ORDER — MISOPROSTOL 25 MCG QUARTER TABLET
25.0000 ug | ORAL_TABLET | ORAL | Status: DC
  Administered 2013-02-12: 25 ug via VAGINAL
  Filled 2013-02-12: qty 0.25

## 2013-02-12 MED ORDER — LACTATED RINGERS IV SOLN: 500.0000 mL | INTRAVENOUS | Status: DC | PRN

## 2013-02-12 MED ORDER — ZOLPIDEM TARTRATE 5 MG PO TABS: 5.0000 mg | ORAL_TABLET | Freq: Every evening | ORAL | Status: DC | PRN

## 2013-02-12 MED ORDER — FENTANYL 2.5 MCG/ML BUPIVACAINE 1/10 % EPIDURAL INFUSION (WH - ANES)
14.0000 mL/h | INTRAMUSCULAR | Status: DC | PRN
  Administered 2013-02-13: 14 mL/h via EPIDURAL
  Filled 2013-02-12 (×2): qty 125

## 2013-02-12 MED ORDER — CITRIC ACID-SODIUM CITRATE 334-500 MG/5ML PO SOLN: 30.0000 mL | ORAL | Status: DC | PRN

## 2013-02-12 MED ORDER — LEVETIRACETAM 500 MG PO TABS
750.0000 mg | ORAL_TABLET | Freq: Two times a day (BID) | ORAL | Status: DC
  Filled 2013-02-12 (×2): qty 1.5

## 2013-02-12 MED ORDER — LACTATED RINGERS IV SOLN
500.0000 mL | Freq: Once | INTRAVENOUS | Status: AC
  Administered 2013-02-13: 500 mL via INTRAVENOUS

## 2013-02-12 MED ORDER — EPHEDRINE 5 MG/ML INJ: 10.0000 mg | INTRAVENOUS | Status: DC | PRN

## 2013-02-12 MED ORDER — OXYTOCIN BOLUS FROM INFUSION: 500.0000 mL | INTRAVENOUS | Status: DC

## 2013-02-12 MED ORDER — DIPHENHYDRAMINE HCL 50 MG/ML IJ SOLN: 12.5000 mg | INTRAMUSCULAR | Status: DC | PRN

## 2013-02-13 ENCOUNTER — Encounter (HOSPITAL_COMMUNITY): Payer: Self-pay | Admitting: Anesthesiology

## 2013-02-13 ENCOUNTER — Inpatient Hospital Stay (HOSPITAL_COMMUNITY): Payer: 59 | Admitting: Anesthesiology

## 2013-02-13 ENCOUNTER — Encounter (HOSPITAL_COMMUNITY): Payer: 59 | Admitting: Anesthesiology

## 2013-02-13 LAB — RPR: RPR Ser Ql: NONREACTIVE

## 2013-02-13 MED ORDER — LEVETIRACETAM 750 MG PO TABS
750.0000 mg | ORAL_TABLET | Freq: Two times a day (BID) | ORAL | Status: DC
  Administered 2013-02-14 – 2013-02-15 (×3): 750 mg via ORAL
  Filled 2013-02-13 (×6): qty 1

## 2013-02-13 MED ORDER — SIMETHICONE 80 MG PO CHEW: 80.0000 mg | CHEWABLE_TABLET | ORAL | Status: DC | PRN

## 2013-02-13 MED ORDER — ONDANSETRON HCL 4 MG PO TABS: 4.0000 mg | ORAL_TABLET | ORAL | Status: DC | PRN

## 2013-02-13 MED ORDER — DIPHENHYDRAMINE HCL 25 MG PO CAPS: 25.0000 mg | ORAL_CAPSULE | Freq: Four times a day (QID) | ORAL | Status: DC | PRN

## 2013-02-13 MED ORDER — ZOLPIDEM TARTRATE 5 MG PO TABS: 5.0000 mg | ORAL_TABLET | Freq: Every evening | ORAL | Status: DC | PRN

## 2013-02-13 MED ORDER — FOLIC ACID 1 MG PO TABS
4.0000 mg | ORAL_TABLET | Freq: Every day | ORAL | Status: DC
  Administered 2013-02-14 – 2013-02-15 (×2): 4 mg via ORAL
  Filled 2013-02-13 (×3): qty 4

## 2013-02-13 MED ORDER — LANOLIN HYDROUS EX OINT: TOPICAL_OINTMENT | CUTANEOUS | Status: DC | PRN

## 2013-02-13 MED ORDER — BENZOCAINE-MENTHOL 20-0.5 % EX AERO
1.0000 "application " | INHALATION_SPRAY | CUTANEOUS | Status: DC | PRN
  Administered 2013-02-13 – 2013-02-14 (×2): 1 via TOPICAL
  Filled 2013-02-13 (×3): qty 56

## 2013-02-13 MED ORDER — ONDANSETRON HCL 4 MG/2ML IJ SOLN: 4.0000 mg | INTRAMUSCULAR | Status: DC | PRN

## 2013-02-13 MED ORDER — LIDOCAINE HCL (PF) 1 % IJ SOLN
INTRAMUSCULAR | Status: DC | PRN
  Administered 2013-02-13 (×2): 9 mL

## 2013-02-13 MED ORDER — OXYCODONE-ACETAMINOPHEN 5-325 MG PO TABS
1.0000 | ORAL_TABLET | ORAL | Status: DC | PRN
  Administered 2013-02-13 – 2013-02-15 (×5): 1 via ORAL
  Filled 2013-02-13 (×5): qty 1

## 2013-02-13 MED ORDER — FENTANYL 2.5 MCG/ML BUPIVACAINE 1/10 % EPIDURAL INFUSION (WH - ANES)
INTRAMUSCULAR | Status: DC | PRN
  Administered 2013-02-13: 14 mL/h via EPIDURAL

## 2013-02-13 MED ORDER — IBUPROFEN 600 MG PO TABS
600.0000 mg | ORAL_TABLET | Freq: Four times a day (QID) | ORAL | Status: DC
  Administered 2013-02-13 – 2013-02-15 (×7): 600 mg via ORAL
  Filled 2013-02-13 (×7): qty 1

## 2013-02-13 MED ORDER — PRENATAL MULTIVITAMIN CH
1.0000 | ORAL_TABLET | Freq: Every day | ORAL | Status: DC
  Administered 2013-02-14: 1 via ORAL
  Filled 2013-02-13: qty 1

## 2013-02-13 MED ORDER — SENNOSIDES-DOCUSATE SODIUM 8.6-50 MG PO TABS
2.0000 | ORAL_TABLET | ORAL | Status: DC
  Administered 2013-02-14 – 2013-02-15 (×2): 2 via ORAL
  Filled 2013-02-13 (×2): qty 2

## 2013-02-13 MED ORDER — FERROUS SULFATE 325 (65 FE) MG PO TABS
325.0000 mg | ORAL_TABLET | Freq: Two times a day (BID) | ORAL | Status: DC
  Administered 2013-02-13 – 2013-02-15 (×4): 325 mg via ORAL
  Filled 2013-02-13 (×6): qty 1

## 2013-02-13 MED ORDER — DIBUCAINE 1 % RE OINT
1.0000 "application " | TOPICAL_OINTMENT | RECTAL | Status: DC | PRN
  Filled 2013-02-13: qty 28

## 2013-02-13 MED ORDER — WITCH HAZEL-GLYCERIN EX PADS: 1.0000 "application " | MEDICATED_PAD | CUTANEOUS | Status: DC | PRN

## 2013-02-13 NOTE — Anesthesia Procedure Notes (Signed)
Epidural Patient location during procedure: OB Start time: 02/13/2013 2:18 AM End time: 02/13/2013 2:22 AM  Staffing Anesthesiologist: Sandrea Hughs Performed by: anesthesiologist   Preanesthetic Checklist Completed: patient identified, surgical consent, pre-op evaluation, timeout performed, IV checked, risks and benefits discussed and monitors and equipment checked  Epidural Patient position: sitting Prep: site prepped and draped and DuraPrep Patient monitoring: continuous pulse ox and blood pressure Approach: midline Injection technique: LOR air  Needle:  Needle type: Tuohy  Needle gauge: 17 G Needle length: 9 cm and 9 Needle insertion depth: 6 cm Catheter type: closed end flexible Catheter size: 19 Gauge Catheter at skin depth: 11 cm Test dose: negative and Other  Assessment Sensory level: T9 Events: blood not aspirated, injection not painful, no injection resistance, negative IV test and no paresthesia  Additional Notes Reason for block:procedure for pain

## 2013-02-13 NOTE — H&P (Signed)
Lori Alvarado is a 32 y.o. female presenting for induction @ 40 2/[redacted] weeks gestation due to postdates. Hx epilepsy on Keppra no sz activity for years  History OB History   Grav Para Term Preterm Abortions TAB SAB Ect Mult Living   1              Past Medical History  Diagnosis Date  . HSV-1 (herpes simplex virus 1) infection   . Seizure disorder   . 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 No past surgeries     Family History: family history includes Cancer (age of onset: 26) in her cousin; Heart disease in her maternal grandmother; Hypertension in her maternal aunt and maternal grandmother. Social History:  reports that she has never smoked. She has never used smokeless tobacco. She reports that she drinks alcohol. She reports that she does not use illicit drugs.   Prenatal Transfer Tool  Maternal Diabetes: No Genetic Screening: Normal Maternal Ultrasounds/Referrals: Normal Fetal Ultrasounds or other Referrals:  None Maternal Substance Abuse:  No Significant Maternal Medications:  Meds include: Other: keppra Significant Maternal Lab Results:  Lab values include: Group B Strep negative Other Comments:  seizure disordern med  ROS neg  Dilation: 3 Effacement (%): 90 Station: +1 Exam by:: AL Rinehart RN Blood pressure 119/76, pulse 68, temperature 98.5 F (36.9 C), temperature source Oral, resp. rate 18, height 5\' 1"  (1.549 m), weight 83.915 kg (185 lb), last menstrual period 04/08/2012. Exam Physical Exam  Constitutional: She is oriented to person, place, and time. She appears well-developed and well-nourished.  HENT:  Head: Atraumatic.  Eyes: EOM are normal.  Neck: Neck supple.  Cardiovascular: Regular rhythm.   Respiratory: Effort normal.  GI: Soft.  Musculoskeletal: Normal range of motion.  Neurological: She is alert and oriented to person, place, and time.  Skin: Skin is warm.     Prenatal labs: ABO, Rh: O/Positive/-- (03/11 0000) Antibody: Negative (03/11 0000) Rubella: Immune (03/11 0000) RPR: NON REACTIVE (10/14 2015)  HBsAg: Negative (03/11 0000)  HIV: Non-reactive (07/21 0000)  GBS: Negative (09/10 0000)   Assessment/Plan: Post term Seizure disorder P) admit routine labs. Cont keppra. Cytotec. Analgesic prn  Aairah Negrette A 02/13/2013, 1:47 AM

## 2013-02-13 NOTE — Progress Notes (Signed)
Lori Alvarado is a 31 y.o. G1P0 at [redacted]w[redacted]d by LMP admitted for induction of labor due to Post dates. Due date 10/12.  Subjective: No chief complaint on file.  Notes some rectal pressure  Objective: BP 132/59  Pulse 81  Temp(Src) 99.1 F (37.3 C) (Oral)  Resp 18  Ht 5\' 1"  (1.549 m)  Wt 83.915 kg (185 lb)  BMI 34.97 kg/m2  LMP 04/08/2012    ineffective pushing noted  FHT:  FHR: 120 bpm, variability: moderate,  accelerations:  Present,  decelerations:  Absent UC:   regular, every 2-3 minutes SVE:   10 cm dilated, 100% effaced, +2 station, OP Tracing: cat 1  Labs: Lab Results  Component Value Date   WBC 11.4* 02/12/2013   HGB 13.6 02/12/2013   HCT 39.7 02/12/2013   MCV 85.6 02/12/2013   PLT 172 02/12/2013    Assessment / Plan: Postdate Seizure disorder P) exaggerated left sims position Labor vtx down   Anticipated MOD:  NSVD  Goldie Dimmer A 02/13/2013, 8:54 AM

## 2013-02-13 NOTE — Progress Notes (Signed)
S: uncomfortable with ctx  O": SROM @ 10:45 am  S/p cytotec  VE: 3/90/+1  Tracing: baseline 120 (+) accel to 150 Ctx q 2-3 mins  [id number] Seizure disorder Early labor P) Epidural. Cont keppra. Pitocin prn

## 2013-02-13 NOTE — Progress Notes (Signed)
Pt unable to void at 1945 so bladder scan done and 836 was scanned pt tried to void and was re-scanned. She went 200 ml because scan showed 662 after void. In out cath done and of urine came out with catheter.

## 2013-02-13 NOTE — Anesthesia Preprocedure Evaluation (Signed)
Anesthesia Evaluation  Patient identified by MRN, date of birth, ID band Patient awake    Reviewed: Allergy & Precautions, H&P , NPO status , Patient's Chart, lab work & pertinent test results  Airway Mallampati: II TM Distance: >3 FB Neck ROM: full    Dental no notable dental hx.    Pulmonary neg pulmonary ROS,          Cardiovascular negative cardio ROS      Neuro/Psych Seizures -,  negative psych ROS   GI/Hepatic negative GI ROS, Neg liver ROS,   Endo/Other  negative endocrine ROS  Renal/GU negative Renal ROS  negative genitourinary   Musculoskeletal negative musculoskeletal ROS (+)   Abdominal Normal abdominal exam  (+)   Peds  Hematology negative hematology ROS (+)   Anesthesia Other Findings   Reproductive/Obstetrics (+) Pregnancy                           Anesthesia Physical Anesthesia Plan  ASA: II  Anesthesia Plan: Epidural   Post-op Pain Management:    Induction:   Airway Management Planned:   Additional Equipment:   Intra-op Plan:   Post-operative Plan:   Informed Consent: I have reviewed the patients History and Physical, chart, labs and discussed the procedure including the risks, benefits and alternatives for the proposed anesthesia with the patient or authorized representative who has indicated his/her understanding and acceptance.     Plan Discussed with:   Anesthesia Plan Comments:         Anesthesia Quick Evaluation

## 2013-02-14 ENCOUNTER — Encounter (HOSPITAL_COMMUNITY): Payer: Self-pay | Admitting: *Deleted

## 2013-02-14 LAB — CBC
HCT: 35.2 % — ABNORMAL LOW (ref 36.0–46.0)
Hemoglobin: 11.9 g/dL — ABNORMAL LOW (ref 12.0–15.0)
MCH: 29.2 pg (ref 26.0–34.0)
MCHC: 33.8 g/dL (ref 30.0–36.0)
MCV: 86.3 fL (ref 78.0–100.0)
RDW: 14.1 % (ref 11.5–15.5)

## 2013-02-14 NOTE — Anesthesia Postprocedure Evaluation (Signed)
  Anesthesia Post-op Note  Patient: Lori Alvarado  Procedure(s) Performed: * No procedures listed *  Patient Location: PACU and Mother/Baby  Anesthesia Type:Epidural  Level of Consciousness: awake, alert  and oriented  Airway and Oxygen Therapy: Patient Spontanous Breathing  Post-op Pain: none  Post-op Assessment: Post-op Vital signs reviewed, Patient's Cardiovascular Status Stable, No headache, No backache, No residual numbness and No residual motor weakness  Post-op Vital Signs: Reviewed and stable  Complications: No apparent anesthesia complications

## 2013-02-14 NOTE — Progress Notes (Signed)
Patient ID: Lori Alvarado, female   DOB: [birth year], 32 y.o.   MRN: [id number] PPD # 1 SVD  S:  Reports feeling sore, but well             Tolerating po/ No nausea or vomiting             Bleeding is light             Pain controlled with ibuprofen (OTC)             Up ad lib / ambulatory / voiding without difficulties.   Newborn  Information for the patient's newborn:  Ruthanna, Macchia [[id number]]  female  breast feeding  / Circumcision done   O:  A & O x 3, in no apparent distress, very pleasant             VS:  Filed Vitals:   02/13/13 1505 02/13/13 1559 02/13/13 1944 02/14/13 0600  BP: 116/75 117/76 129/71 99/52  Pulse: 97 100 99 79  Temp: 97.9 F (36.6 C) 97.5 F (36.4 C) 98.4 F (36.9 C) 98.5 F (36.9 C)  TempSrc: Oral Oral Oral Oral  Resp: 20 20 16 17   Height:      Weight:      SpO2:    100%    LABS:  Recent Labs  02/12/13 2015 02/14/13 0700  WBC 11.4* 16.4*  HGB 13.6 11.9*  HCT 39.7 35.2*  PLT 172 154    Blood type: O/Positive/-- (03/11 0000)  Rubella: Immune (03/11 0000)     Lungs: Clear and unlabored  Heart: regular rate and rhythm / no murmurs  Abdomen: soft, non-tender, non-distended, normal bowel sounds             Fundus: firm, non-tender, U-1  Perineum: 2nd degree repair healing well  Lochia: light  Extremities: no edema, no calf pain or tenderness, no Homans    A/P: PPD # 1  32 y.o., G1P1001   Active Problems:   Postpartum care following vaginal delivery (10/15)   Doing well - stable status  Routine post partum orders  Anticipate discharge tomorrow   Raelyn Mora, M, MSN, CNM 02/14/2013, 9:57 AM

## 2013-02-15 ENCOUNTER — Ambulatory Visit: Payer: Self-pay

## 2013-02-15 MED ORDER — LEVETIRACETAM 500 MG PO TABS: 500.0000 mg | ORAL_TABLET | Freq: Two times a day (BID) | ORAL | Status: DC

## 2013-02-15 MED ORDER — IBUPROFEN 600 MG PO TABS: 600.0000 mg | ORAL_TABLET | Freq: Four times a day (QID) | ORAL | Status: DC

## 2013-02-15 NOTE — Progress Notes (Signed)
Patient ID: MEGHANA TULLO, female   DOB: Aug 08, 1980, 32 y.o.   MRN: [id number] Post Partum Day #2            Information for the patient's newborn:  Shaunta, Oncale [[id number]]  female   / circumcision done Feeding: breast  Subjective: No HA, SOB, CP, F/C, breast symptoms. Pain well-controlled at ibuprofen. Normal vaginal bleeding, no clots.      Objective:  Temp:  [98 F (36.7 C)-98.4 F (36.9 C)] 98.4 F (36.9 C) (10/17 0627) Pulse Rate:  [74-83] 74 (10/17 0627) Resp:  [20] 20 (10/17 0627) BP: (105-122)/(70-76) 105/70 mmHg (10/17 0627)     Recent Labs  02/12/13 2015 02/14/13 0700  WBC 11.4* 16.4*  HGB 13.6 11.9*  HCT 39.7 35.2*  PLT 172 154    Blood type: O/Positive/-- (03/11 0000) Rubella: Immune (03/11 0000)    Physical Exam:  General: alert, cooperative and no distress Uterine Fundus: firm Lochia: appropriate Perineum: 2nd degree repair healing well, edema none DVT Evaluation: No evidence of DVT seen on physical exam. Negative Homan's sign. No cords or calf tenderness. No significant calf/ankle edema.    Assessment/Plan: PPD # 2 / 32 y.o., G1P1001 S/P: spontaneous vaginal   Active Problems:   Postpartum care following vaginal delivery (10/15)   Normal postpartum exam  Continue current postpartum care  D/C home   LOS: 3 days   Raelyn Mora, M, MSN, CNM 02/15/2013, 7:55 AM

## 2013-02-15 NOTE — Discharge Summary (Signed)
Obstetric Discharge Summary Reason for Admission: induction of labor 2nd to postdates Prenatal Procedures: ultrasound Intrapartum Procedures: vacuum Postpartum Procedures: none Complications-Operative and Postpartum: 2nd degree perineal laceration Hemoglobin  Date Value Range Status  02/14/2013 11.9* 12.0 - 15.0 g/dL Final     HCT  Date Value Range Status  02/14/2013 35.2* 36.0 - 46.0 % Final    Physical Exam:  General: alert, cooperative and no distress Lochia: appropriate Uterine Fundus: firm DVT Evaluation: No evidence of DVT seen on physical exam. Negative Homan's sign. No cords or calf tenderness. No significant calf/ankle edema.  Discharge Diagnoses: Term Pregnancy-delivered 2) seizure disorder  Discharge Information: Date: 02/15/2013 Activity: pelvic rest Diet: routine Medications: PNV and Ibuprofen Condition: stable Instructions: refer to practice specific booklet Discharge to: home Follow-up Information   Follow up with Edenilson Austad A, MD. Schedule an appointment as soon as possible for a visit in 6 weeks.   Specialty:  Obstetrics and Gynecology   Contact information:   [address] [place] [address] [phone]       Newborn Data: Live born female on 02/13/2013 Birth Weight: 8 lb 0.6 oz (3646 g) APGAR: 7, 9  Home with mother.  Kenard Gower, MSN, CNM 02/15/2013, 8:01 AM

## 2013-02-15 NOTE — Lactation Note (Signed)
This note was copied from the chart of Lori Dominica Azucena. Lactation Consultation Note  Baby fussy when attempt latch baby nuzzled and licked at the breast no latch at this time. Mom denies concerns regarding breast feeding problems or pain.  Discharge information given including feeding with cues, hand expression and engorgement.  Mom to call [facility] office for questions or concerns after discharge.   Patient Name: Lori Alvarado RUEAV'W Date: 02/15/2013 Reason for consult: Follow-up assessment;Difficult latch   Maternal Data    Feeding Feeding Type: Breast Fed Length of feed:  (few minutes lick and nuzzle breast)  LATCH Score/Interventions Latch: Too sleepy or reluctant, no latch achieved, no sucking elicited. Intervention(s): Skin to skin;Teach feeding cues;Waking techniques Intervention(s): Adjust position;Assist with latch;Breast massage;Breast compression  Audible Swallowing: None Intervention(s): Skin to skin;Hand expression Intervention(s): Alternate breast massage  Type of Nipple: Everted at rest and after stimulation  Comfort (Breast/Nipple): Soft / non-tender     Hold (Positioning): No assistance needed to correctly position infant at breast.  LATCH Score: 6  Lactation Tools Discussed/Used     Consult Status Consult Status: Complete    Alizza Sacra, Arvella Merles 02/15/2013, 12:10 PM

## 2013-02-22 ENCOUNTER — Ambulatory Visit (HOSPITAL_COMMUNITY)
Admission: RE | Admit: 2013-02-22 | Discharge: 2013-02-22 | Disposition: A | Discharge status: Home / Self Care | Payer: 59 | Source: Ambulatory Visit | Attending: Obstetrics and Gynecology | Admitting: Obstetrics and Gynecology

## 2013-02-22 NOTE — Lactation Note (Addendum)
Adult Lactation Consultation Outpatient Visit Note  Patient Name: Deniz Eskridge Dimino                                          "Price" Date of Birth: April 06, 1981                                                            36 days old Gestational Age at Delivery: [redacted]w[redacted]d                                          Weight today: 7-9, 3434 Type of Delivery:   Breastfeeding History: Frequency of Breastfeeding: one -two times daily Length of Feeding: 10-15 mins, most are feeding attempts Voids: 8-10 Stools: 8-10, brownish yellow  Supplementing / Method: Bottle feeding 2 ounces every 2-3 hours Pumping:  Type of Pump:Medela Pump N Style   Frequency:4 times daily  Volume:  1-1 1/2 each pumping  Comments:Mother states that Price lost 1 lb within the first 3-4 days of life.  Mother started supplementing with formula and pumping on day 4 of life. She has been pumping  at least 4 times daily. She phoned to schedule a consult for low milk supply. Mother states that Samuella Cota has difficulty with latch. She states maybe one to times daily she gets him latch for only a few mins. Discussed mother goal for breastfeeding. She states she wants infant to breast feed.    Consultation Evaluation:Mothers breast are firm. Mother has been using a nipple shield on (R) breast due to flat tissue. She latched infant on (L) breast without nipple shield. She states she has not pumped this am. Price was fed 2 ounces of formula at 8:45 this am.  Assist with mothers latch technique. Infant latched well with good deep latch without nipple shield on (L) breast.  Mother was taught good breast compression. Infant transferred 26 ml.   Initial Feeding Assessment:Mother was offered use of a Double SNS but declines, she prefers to use a bottle for supplement. Pre-feed [id number]  Post-feed [id number] Amount Transferred:26 ml Comments:  Additional Feeding Assessment:assist mother with proper application of #24 Nipple shield. Infant  sustained latch with wide open mouth for only 10 mins and transferred 8 ml . Observed good flow of milk in tip of nipple shield. Pre-feed [id number] Post-feed [id number] Amount Transferred:8 ml  Comments:Price had a large stool and wet diaper    Total Breast milk Transferred this Visit: 34 ml Total Supplement Given: Recommend that mother supplement with a bottle using her EBM a or formula using a bottle , and give at least 30 ml after each feeding.   Additional Interventions: Recommend that mother offer breast using a #24 nipple shield on each breast with each feeding. Suggest that mother pre fill nipple shield with EBM/formula to entice infant to latch. Encouraged mother to do good breast compression . Mother to supplement with a bottle after each feeding . Infant to be fed  at least 30 ml or as much as he will take,of EBM/formula . Encouraged mother to rouse infant with STS and feeding  infant at least every 2-3 hours.  Advised mother to post pump at least 6 times daily for 20 mins. Encouraged good breast massage and hand expression . Recommend that mother use a supplement , (Mothers Milk Plus), eat oatmeal. Mother to follow up in one week for feeding assessment. Mother will call for a Lactation appt. Informed mother of BFSG. [facility] to follow up next Tuesday for weight check. Encouraged her to have smart start nurse to do pre and post weights.  Follow-Up  PRN     Stevan Born [facility] 02/22/2013, 12:16 PM

## 2013-03-07 ENCOUNTER — Other Ambulatory Visit: Payer: Self-pay

## 2013-03-20 ENCOUNTER — Ambulatory Visit: Payer: Self-pay | Admitting: Neurology

## 2013-03-20 ENCOUNTER — Encounter: Payer: Self-pay | Admitting: Neurology

## 2013-03-20 ENCOUNTER — Ambulatory Visit (INDEPENDENT_AMBULATORY_CARE_PROVIDER_SITE_OTHER): Payer: 59 | Admitting: Neurology

## 2013-03-20 VITALS — BP 135/85 | HR 91 | Wt 168.0 lb

## 2013-03-20 DIAGNOSIS — G40909 Epilepsy, unspecified, not intractable, without status epilepticus: Secondary | ICD-10-CM

## 2013-03-20 MED ORDER — LEVETIRACETAM 750 MG PO TABS: 750.0000 mg | ORAL_TABLET | Freq: Two times a day (BID) | ORAL | Status: DC

## 2013-03-20 NOTE — Progress Notes (Signed)
Reason for visit: Seizures  Lori Alvarado is an 32 y.o. female  History of present illness:  Lori Alvarado is a 32 year old right-handed white female with a history of seizures, and migraine headache. The patient has delivered a child 5 weeks ago, and  she has done well with this. The Keppra was increased from 500 mg to 750 milligrams twice daily in the third trimester of the pregnancy. The patient is tolerating the medication well without drowsiness or irritability. The patient has not had any seizures in over 3 years. The patient returns to this office for an evaluation. The patient indicates that she is not sleeping as well as she should given the newborn. The patient is having occasional episodes of "ice pick pains", without any recurrence of her migraine. In the past, Topamax has helped these ice pick pains some.  Past Medical History  Diagnosis Date  . HSV-1 (herpes simplex virus 1) infection   . Seizure disorder   . 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 Migraine headache     Past Surgical History  Procedure Laterality Date  . No past surgeries      Family History  Problem Relation Age of Onset  . Hypertension Maternal Grandmother   . Heart disease Maternal Grandmother   . Cancer Cousin 21    DIED WITH LIVER/LUNG CANCER  . Hypertension Maternal Aunt   . Seizures Maternal Aunt     Social history:  reports that she has never smoked. She has never used smokeless tobacco. She reports that she drinks alcohol. She reports that she does not use illicit drugs.    Allergies  Allergen Reactions  . Lamictal [Lamotrigine] Other (See Comments)    Made her feel crazy    Medications:  Current Outpatient Prescriptions on File Prior to Visit  Medication Sig Dispense Refill  . ibuprofen (ADVIL,MOTRIN) 600 MG tablet Take 1 tablet (600 mg total) by mouth every 6 (six) hours.  30 tablet  0  . OVER THE COUNTER MEDICATION Calcium and vit D qd         No current facility-administered medications on file prior to visit.    ROS:  Out of a complete 14 system review of symptoms, the patient complains only of the following symptoms, and all other reviewed systems are negative.  Headache  Blood pressure 135/85, pulse 91, weight 168 lb (76.204 kg).  Physical Exam  General: The patient is alert and cooperative at the time of the examination. The patient is minimally obese.  Skin: No significant peripheral edema is noted.   Neurologic Exam  Mental status: The patient is oriented x 3.  Cranial nerves: Facial symmetry is present. Speech is normal, no aphasia or dysarthria is noted. Extraocular movements are full. Visual fields are full.  Motor: The patient has good strength in all 4 extremities.  Sensory examination: Soft touch sensation on the hands and face is symmetric.  Coordination: The patient has good finger-nose-finger and heel-to-shin bilaterally.  Gait and station: The patient has a normal gait. Tandem gait is normal. Romberg is negative. No drift is seen.  Reflexes: Deep tendon reflexes are symmetric.   Assessment/Plan:  1. History seizures  2. History of migraine  The patient is doing fairly well at this point. The patient will remain on Keppra at 750 mg twice daily. The patient will followup in about 6 months. It may be reasonable to keep the patient at the current dose indefinitely. The patient  is tolerating the medication well.  Marlan Palau MD 03/20/2013 8:38 PM  [facility] [address] [place], [place] [address]  Phone [phone] Fax [phone]

## 2013-03-20 NOTE — Patient Instructions (Signed)
Epilepsy A seizure (convulsion) is a sudden change in brain function that causes a change in behavior, muscle activity, or ability to remain awake and alert. If a person has recurring seizures, this is called epilepsy. CAUSES  Epilepsy is a disorder with many possible causes. Anything that disturbs the normal pattern of brain cell activity can lead to seizures. Seizure can be caused from illness to brain damage to abnormal brain development. Epilepsy may develop because of:  An abnormality in brain wiring.  An imbalance of nerve signaling chemicals (neurotransmitters).  Some combination of these factors. Scientists are learning an increasing amount about genetic causes of seizures. SYMPTOMS  The symptoms of a seizure can vary greatly from one person to another. These may include:  An aura, or warning that tells a person they are about to have a seizure.  Abnormal sensations, such as abnormal smell or seeing flashing lights.  Sudden, general body stiffness.  Rhythmic jerking of the face, arm, or leg  on one or both sides.  Sudden change in consciousness.  The person may appear to be awake but not responding.  They may appear to be asleep but cannot be awakened.  Grimacing, chewing, lip smacking, or drooling.  Often there is a period of sleepiness after a seizure. DIAGNOSIS  The description you give to your caregiver about what you experienced will help them understand your problems. Equally important is the description by any witnesses to your seizure. A physical exam, including a detailed neurological exam, is necessary. An EEG (electroencephalogram) is a painless test of your brain waves. In this test a diagram is created of your brain waves. These diagrams can be interpreted by a specialist. Pictures of your brain are usually taken with:  An MRI.  A CT scan. Lab tests may be done to look for:  Signs of infection.  Abnormal blood chemistry. PREVENTION  There is no way to  prevent the development of epilepsy. If you have seizures that are typically triggered by an event (such as flashing lights), try to avoid the trigger. This can help you avoid a seizure.  PROGNOSIS  Most people with epilepsy lead outwardly normal lives. While epilepsy cannot currently be cured, for some people it does eventually go away. Most seizures do not cause brain damage. It is not uncommon for people with epilepsy, especially children, to develop behavioral and emotional problems. These problems are sometimes the consequence of medicine for seizures or social stress. For some people with epilepsy, the risk of seizures restricts their independence and recreational activities. For example, some states refuse drivers licenses to people with epilepsy. Most women with epilepsy can become pregnant. They should discuss their epilepsy and the medicine they are taking with their caregivers. Women with epilepsy have a 90 percent or better chance of having a normal, healthy baby. RISKS AND COMPLICATIONS  People with epilepsy are at increased risk of falls, accidents, and injuries. People with epilepsy are at special risk for two life-threatening conditions. These are status epilepticus and sudden unexplained death (extremely rare). Status epilepticus is a long lasting, continuous seizure that is a medical emergency. TREATMENT  Once epilepsy is diagnosed, it is important to begin treatment as soon as possible. For about 80 percent of those diagnosed with epilepsy, seizures can be controlled with modern medicines and surgical techniques. Some antiepileptic drugs can interfere with the effectiveness of oral contraceptives. In 1997, the FDA approved a pacemaker for the brain the (vagus nerve stimulator). This stimulator can be used for   people with seizures that are not well-controlled by medicine. Studies have shown that in some cases, children may experience fewer seizures if they maintain a strict diet. The strict  diet is called the ketogenic diet. This diet is rich in fats and low in carbohydrates. HOME CARE INSTRUCTIONS   Your caregiver will make recommendations about driving and safety in normal activities. Follow these carefully.  Take any medicine prescribed exactly as directed.  Do any blood tests requested to monitor the levels of your medicine.  The people you live and work with should know that you are prone to seizures. They should receive instructions on how to help you. In general, a witness to a seizure should:  Cushion your head and body.  Turn you on your side.  Avoid unnecessarily restraining you.  Not place anything inside your mouth.  Call for local emergency medical help if there is any question about what has occurred.  Keep a seizure diary. Record what you recall about any seizure, especially any possible trigger.  If your caregiver has given you a follow-up appointment, it is very important to keep that appointment. Not keeping the appointment could result in permanent injury and disability. If there is any problem keeping the appointment, you must call back to this facility for assistance. SEEK MEDICAL CARE IF:   You develop signs of infection or other illness. This might increase the risk of a seizure.  You seem to be having more frequent seizures.  Your seizure pattern is changing. SEEK IMMEDIATE MEDICAL CARE IF:   A seizure does not stop after a few moments.  A seizure causes any difficulty in breathing.  A seizure results in a very severe headache.  A seizure leaves you with the inability to speak or use a part of your body. MAKE SURE YOU:   Understand these instructions.  Will watch your condition.  Will get help right away if you are not doing well or get worse. Document Released: 04/18/2005 Document Revised: 07/11/2011 Document Reviewed: 11/28/2012 Exit[employer] Patient Information 2014 [employer].  

## 2013-04-05 ENCOUNTER — Other Ambulatory Visit: Payer: Self-pay | Admitting: Neurology

## 2013-05-15 ENCOUNTER — Other Ambulatory Visit: Payer: Self-pay | Admitting: Neurology

## 2013-05-16 NOTE — Telephone Encounter (Signed)
We have been prescribing this med for several years by viewing the chart in [place].

## 2013-06-21 ENCOUNTER — Telehealth: Payer: Self-pay

## 2013-06-21 NOTE — Telephone Encounter (Signed)
Delivered her baby in Oct. C/o hemorrhoids.  Asking what you might recommend or if you might could call in Rx?

## 2013-06-21 NOTE — Telephone Encounter (Signed)
I spoke with patient. This problem has been ongoing for a few months. She has tried stool softeners. Has pain and bleeding with BM.  Offered OV to let [place] check hemorrhoids to see if perhaps she needs referrral to surgeon or what [place] can recommend.  Pt agrees to office visit. Transferred to [facility] to schedule for next week.

## 2013-06-25 ENCOUNTER — Ambulatory Visit (INDEPENDENT_AMBULATORY_CARE_PROVIDER_SITE_OTHER): Payer: 59 | Admitting: Women's Health

## 2013-06-25 ENCOUNTER — Encounter: Payer: Self-pay | Admitting: Women's Health

## 2013-06-25 DIAGNOSIS — B009 Herpesviral infection, unspecified: Secondary | ICD-10-CM

## 2013-06-25 DIAGNOSIS — K649 Unspecified hemorrhoids: Secondary | ICD-10-CM

## 2013-06-25 MED ORDER — HYDROCORTISONE ACE-PRAMOXINE 2.5-1 % RE CREA: 1.0000 "application " | TOPICAL_CREAM | Freq: Three times a day (TID) | RECTAL | Status: DC

## 2013-06-25 MED ORDER — VALACYCLOVIR HCL 500 MG PO TABS: ORAL_TABLET | ORAL | Status: DC

## 2013-06-25 NOTE — Patient Instructions (Signed)
Hemorrhoids Hemorrhoids are swollen veins around the rectum or anus. There are two types of hemorrhoids:   Internal hemorrhoids. These occur in the veins just inside the rectum. They may poke through to the outside and become irritated and painful.  External hemorrhoids. These occur in the veins outside the anus and can be felt as a painful swelling or hard lump near the anus. CAUSES  Pregnancy.   Obesity.   Constipation or diarrhea.   Straining to have a bowel movement.   Sitting for long periods on the toilet.  Heavy lifting or other activity that caused you to strain.  Anal intercourse. SYMPTOMS   Pain.   Anal itching or irritation.   Rectal bleeding.   Fecal leakage.   Anal swelling.   One or more lumps around the anus.  DIAGNOSIS  Your caregiver may be able to diagnose hemorrhoids by visual examination. Other examinations or tests that may be performed include:   Examination of the rectal area with a gloved hand (digital rectal exam).   Examination of anal canal using a small tube (scope).   A blood test if you have lost a significant amount of blood.  A test to look inside the colon (sigmoidoscopy or colonoscopy). TREATMENT Most hemorrhoids can be treated at home. However, if symptoms do not seem to be getting better or if you have a lot of rectal bleeding, your caregiver may perform a procedure to help make the hemorrhoids get smaller or remove them completely. Possible treatments include:   Placing a rubber band at the base of the hemorrhoid to cut off the circulation (rubber band ligation).   Injecting a chemical to shrink the hemorrhoid (sclerotherapy).   Using a tool to burn the hemorrhoid (infrared light therapy).   Surgically removing the hemorrhoid (hemorrhoidectomy).   Stapling the hemorrhoid to block blood flow to the tissue (hemorrhoid stapling).  HOME CARE INSTRUCTIONS   Eat foods with fiber, such as whole grains, beans,  nuts, fruits, and vegetables. Ask your doctor about taking products with added fiber in them (fibersupplements).  Increase fluid intake. Drink enough water and fluids to keep your urine clear or pale yellow.   Exercise regularly.   Go to the bathroom when you have the urge to have a bowel movement. Do not wait.   Avoid straining to have bowel movements.   Keep the anal area dry and clean. Use wet toilet paper or moist towelettes after a bowel movement.   Medicated creams and suppositories may be used or applied as directed.   Only take over-the-counter or prescription medicines as directed by your caregiver.   Take warm sitz baths for 15 20 minutes, 3 4 times a day to ease pain and discomfort.   Place ice packs on the hemorrhoids if they are tender and swollen. Using ice packs between sitz baths may be helpful.   Put ice in a plastic bag.   Place a towel between your skin and the bag.   Leave the ice on for 15 20 minutes, 3 4 times a day.   Do not use a donut-shaped pillow or sit on the toilet for long periods. This increases blood pooling and pain.  SEEK MEDICAL CARE IF:  You have increasing pain and swelling that is not controlled by treatment or medicine.  You have uncontrolled bleeding.  You have difficulty or you are unable to have a bowel movement.  You have pain or inflammation outside the area of the hemorrhoids. MAKE SURE YOU:    Understand these instructions.  Will watch your condition.  Will get help right away if you are not doing well or get worse. Document Released: 04/15/2000 Document Revised: 04/04/2012 Document Reviewed: 02/21/2012 Exit[employer] Patient Information 2014 [employer].  

## 2013-06-25 NOTE — Progress Notes (Signed)
Patient ID: Lori Alvarado, female   DOB: 1980/11/19, 33 y.o.   MRN: [id number] Presents with complaint of rectal pain , questionable hemorrhoid with occasional bright red bleeding with bowel movements. Has had some problems with constipation since delivery 16 weeks ago. Baby Price thriving. States does not have rectal pain daily. Was on a stool softener that made symptoms worse. History of seizures, seizure-free greater than one year.   Exam: Appears well. Abdomen soft, nontender. External genitalia within normal limits. One small half centimeter nonthrombosed hemorrhoid noted. Limited rectal exam, small internal hemorrhoid. No noted blood.  Small hemorrhoids  Plan: Analpram HC 2 to 3 times daily as needed. Reviewed importance of increasing fiber rich foods, fluids and preventing constipation.

## 2013-07-23 ENCOUNTER — Other Ambulatory Visit: Payer: Self-pay | Admitting: Women's Health

## 2013-07-23 DIAGNOSIS — B009 Herpesviral infection, unspecified: Secondary | ICD-10-CM

## 2013-07-23 MED ORDER — VALACYCLOVIR HCL 500 MG PO TABS: ORAL_TABLET | ORAL | Status: DC

## 2013-09-19 ENCOUNTER — Ambulatory Visit (INDEPENDENT_AMBULATORY_CARE_PROVIDER_SITE_OTHER): Payer: 59 | Admitting: Neurology

## 2013-09-19 ENCOUNTER — Encounter: Payer: Self-pay | Admitting: Neurology

## 2013-09-19 VITALS — BP 125/84 | HR 67 | Wt 158.0 lb

## 2013-09-19 DIAGNOSIS — G40909 Epilepsy, unspecified, not intractable, without status epilepticus: Secondary | ICD-10-CM

## 2013-09-19 MED ORDER — LEVETIRACETAM 750 MG PO TABS: 750.0000 mg | ORAL_TABLET | Freq: Two times a day (BID) | ORAL | Status: DC

## 2013-09-19 MED ORDER — TOPIRAMATE 25 MG PO TABS: 25.0000 mg | ORAL_TABLET | Freq: Every day | ORAL | Status: DC

## 2013-09-19 NOTE — Patient Instructions (Signed)
Epilepsy Epilepsy is a disorder in which a person has repeated seizures over time. A seizure is a release of abnormal electrical activity in the brain. Seizures can cause a change in attention, behavior, or the ability to remain awake and alert (altered mental status). Seizures often involve uncontrollable shaking (convulsions).  Most people with epilepsy lead normal lives. However, people with epilepsy are at an increased risk of falls, accidents, and injuries. Therefore, it is important to begin treatment right away. CAUSES  Epilepsy has many possible causes. Anything that disturbs the normal pattern of brain cell activity can lead to seizures. This may include:   Head injury.  Birth trauma.  High fever as a child.  Stroke.  Bleeding into or around the brain.  Certain drugs.  Prolonged low oxygen, such as what occurs after CPR efforts.  Abnormal brain development.  Certain illnesses, such as meningitis, encephalitis (brain infection), malaria, and other infections.  An imbalance of nerve signaling chemicals (neurotransmitters).  SIGNS AND SYMPTOMS  The symptoms of a seizure can vary greatly from one person to another. Right before a seizure, you may have a warning (aura) that a seizure is about to occur. An aura may include the following symptoms:  Fear or anxiety.  Nausea.  Feeling like the room is spinning (vertigo).  Vision changes, such as seeing flashing lights or spots. Common symptoms during a seizure include:  Abnormal sensations, such as an abnormal smell or a bitter taste in the mouth.   Sudden, general body stiffness.   Convulsions that involve rhythmic jerking of the face, arm, or leg on one or both sides.   Sudden change in consciousness.   Appearing to be awake but not responding.   Appearing to be asleep but cannot be awakened.   Grimacing, chewing, lip smacking, drooling, tongue biting, or loss of bowel or bladder control. After a seizure,  you may feel sleepy for a while. DIAGNOSIS  Your health care provider will ask about your symptoms and take a medical history. Descriptions from any witnesses to your seizures will be very helpful in the diagnosis. A physical exam, including a detailed neurological exam, is necessary. Various tests may be done, such as:   An electroencephalogram (EEG). This is a painless test of your brain waves. In this test, a diagram is created of your brain waves. These diagrams can be interpreted by a specialist.  An MRI of the brain.   A CT scan of the brain.   A spinal tap (lumbar puncture, LP).  Blood tests to check for signs of infection or abnormal blood chemistry. TREATMENT  There is no cure for epilepsy, but it is generally treatable. Once epilepsy is diagnosed, it is important to begin treatment as soon as possible. For most people with epilepsy, seizures can be controlled with medicines. The following may also be used:  A pacemaker for the brain (vagus nerve stimulator) can be used for people with seizures that are not well controlled by medicine.  Surgery on the brain. For some people, epilepsy eventually goes away. HOME CARE INSTRUCTIONS   Follow your health care provider's recommendations on driving and safety in normal activities.  Get enough rest. Lack of sleep can cause seizures.  Only take over-the-counter or prescription medicines as directed by your health care provider. Take any prescribed medicine exactly as directed.  Avoid any known triggers of your seizures.  Keep a seizure diary. Record what you recall about any seizure, especially any possible trigger.   Make   sure the people you live and work with know that you are prone to seizures. They should receive instructions on how to help you. In general, a witness to a seizure should:   Cushion your head and body.   Turn you on your side.   Avoid unnecessarily restraining you.   Not place anything inside your  mouth.   Call for emergency medical help if there is any question about what has occurred.   Follow up with your health care provider as directed. You may need regular blood tests to monitor the levels of your medicine.  SEEK MEDICAL CARE IF:   You develop signs of infection or other illness. This might increase the risk of a seizure.   You seem to be having more frequent seizures.   Your seizure pattern is changing.  SEEK IMMEDIATE MEDICAL CARE IF:   You have a seizure that does not stop after a few moments.   You have a seizure that causes any difficulty in breathing.   You have a seizure that results in a very severe headache.   You have a seizure that leaves you with the inability to speak or use a part of your body.  Document Released: 04/18/2005 Document Revised: 02/06/2013 Document Reviewed: 11/28/2012 ExitCare Patient Information 2014 [employer].  

## 2013-09-19 NOTE — Progress Notes (Signed)
    Reason for visit: Seizures  Lori Alvarado is an 33 y.o. female  History of present illness:  Lori Alvarado is a 33 year old right-handed white female with a history of migraine headaches and seizures. She has done well on Keppra taking 750 mg twice daily. She has very rare migraine headaches, but she has frequent ice pick pains that occur daily in nature. The patient is sleeping better at night as her child has gotten older. The patient has not noted any new medical issues that have come up since last seen. She is tolerating the Keppra well. In the past, she has noted that Topamax has helped ice pick pain frequency.  Past Medical History  Diagnosis Date  . HSV-1 (herpes simplex virus 1) infection   . Seizure disorder   . 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 Migraine headache     Past Surgical History  Procedure Laterality Date  . No past surgeries      Family History  Problem Relation Age of Onset  . Hypertension Maternal Grandmother   . Heart disease Maternal Grandmother   . Cancer Cousin 21    DIED WITH LIVER/LUNG CANCER  . Hypertension Maternal Aunt   . Seizures Maternal Aunt     Social history:  reports that she has never smoked. She has never used smokeless tobacco. She reports that she drinks alcohol. She reports that she does not use illicit drugs.    Allergies  Allergen Reactions  . Lamictal [Lamotrigine] Other (See Comments)    Made her feel crazy    Medications:  Current Outpatient Prescriptions on File Prior to Visit  Medication Sig Dispense Refill  . folic acid (FOLVITE) 1 MG tablet TAKE 2 TABLETS DAILY  180 tablet  3  . valACYclovir (VALTREX) 500 MG tablet Take 2 times per day for 3-5 days  30 tablet  12   No current facility-administered medications on file prior to visit.    ROS:  Out of a complete 14 system review of symptoms, the patient complains only of the following symptoms, and all other reviewed systems  are negative.  Headache History of seizures  Blood pressure 125/84, pulse 67, weight 158 lb (71.668 kg), not currently breastfeeding.  Physical Exam  General: The patient is alert and cooperative at the time of the examination.  Skin: No significant peripheral edema is noted.   Neurologic Exam  Mental status: The patient is oriented x 3.  Cranial nerves: Facial symmetry is present. Speech is normal, no aphasia or dysarthria is noted. Extraocular movements are full. Visual fields are full.  Motor: The patient has good strength in all 4 extremities.  Sensory examination: Soft touch sensation is symmetric on the face, arms, and legs.  Coordination: The patient has good finger-nose-finger and heel-to-shin bilaterally.  Gait and station: The patient has a normal gait. Tandem gait is normal. Romberg is negative. No drift is seen.  Reflexes: Deep tendon reflexes are symmetric.   Assessment/Plan:  1. History seizures  2. Migraine headache  The patient continues to have some troubles with ice pick pains, and she wants medications for this. We will start low-dose Topamax taking 25 mg at night. The patient will continue her Keppra, she will followup in 6 months.  Jill Alexanders MD 09/19/2013 9:21 PM  [facility] [address] [place], [place] [address]  Phone [phone] Fax [phone]

## 2013-12-27 ENCOUNTER — Telehealth: Payer: Self-pay | Admitting: Neurology

## 2013-12-27 MED ORDER — TOPIRAMATE 25 MG PO TABS
75.0000 mg | ORAL_TABLET | Freq: Every day | ORAL | Status: DC
Start: 2013-12-27 — End: 2014-02-25

## 2013-12-27 NOTE — Telephone Encounter (Signed)
I called patient. The patient still having a lot of ice take-type pains. We will go up to 75 mg, if this is not effective, we'll go to 100 mg at night.

## 2013-12-27 NOTE — Telephone Encounter (Signed)
Patient requesting an increase in her Topamax dose. She currently is prescribed 25 mg and  wants to up the dose to 75 mg as it is not working at the current dose. She has taken 50 mg on her own and it has not helped.  She would like a new prescription for Topamax 75 mg. Best number to call back is [phone]

## 2013-12-27 NOTE — Telephone Encounter (Signed)
Patient requesting an increase in her Topamax dose. She currently is prescribed 25 mg and wants to up the dose to 75 mg as it is not working at the current dose. She has taken 50 mg on her own and it has not helped.  She would like a new prescription for Topamax 75 mg. Best number to call back is [phone]

## 2014-02-25 ENCOUNTER — Other Ambulatory Visit: Payer: Self-pay | Admitting: Women's Health

## 2014-02-25 ENCOUNTER — Encounter: Payer: Self-pay | Admitting: Women's Health

## 2014-02-25 ENCOUNTER — Ambulatory Visit (INDEPENDENT_AMBULATORY_CARE_PROVIDER_SITE_OTHER): Payer: 59

## 2014-02-25 ENCOUNTER — Emergency Department (HOSPITAL_COMMUNITY): Payer: 59

## 2014-02-25 ENCOUNTER — Ambulatory Visit (INDEPENDENT_AMBULATORY_CARE_PROVIDER_SITE_OTHER): Payer: 59 | Admitting: Women's Health

## 2014-02-25 ENCOUNTER — Encounter (HOSPITAL_COMMUNITY): Payer: Self-pay | Admitting: Emergency Medicine

## 2014-02-25 ENCOUNTER — Emergency Department (HOSPITAL_COMMUNITY)
Admission: EM | Admit: 2014-02-25 | Discharge: 2014-02-25 | Disposition: A | Discharge status: Home / Self Care | Payer: 59 | Attending: Emergency Medicine | Admitting: Emergency Medicine

## 2014-02-25 VITALS — BP 110/74 | Ht 61.0 in | Wt 149.0 lb

## 2014-02-25 DIAGNOSIS — R109 Unspecified abdominal pain: Secondary | ICD-10-CM

## 2014-02-25 DIAGNOSIS — N8300 Follicular cyst of ovary, unspecified side: Secondary | ICD-10-CM

## 2014-02-25 DIAGNOSIS — R1031 Right lower quadrant pain: Secondary | ICD-10-CM | POA: Diagnosis not present

## 2014-02-25 DIAGNOSIS — Z3202 Encounter for pregnancy test, result negative: Secondary | ICD-10-CM | POA: Insufficient documentation

## 2014-02-25 DIAGNOSIS — Z8679 Personal history of other diseases of the circulatory system: Secondary | ICD-10-CM | POA: Insufficient documentation

## 2014-02-25 DIAGNOSIS — Z8619 Personal history of other infectious and parasitic diseases: Secondary | ICD-10-CM | POA: Insufficient documentation

## 2014-02-25 DIAGNOSIS — G40909 Epilepsy, unspecified, not intractable, without status epilepticus: Secondary | ICD-10-CM | POA: Insufficient documentation

## 2014-02-25 DIAGNOSIS — Z79899 Other long term (current) drug therapy: Secondary | ICD-10-CM | POA: Diagnosis not present

## 2014-02-25 DIAGNOSIS — G43909 Migraine, unspecified, not intractable, without status migrainosus: Secondary | ICD-10-CM | POA: Diagnosis not present

## 2014-02-25 LAB — CBC WITH DIFFERENTIAL/PLATELET
BASOS ABS: 0.1 10*3/uL (ref 0.0–0.1)
Basophils Relative: 2 % — ABNORMAL HIGH (ref 0–1)
Eosinophils Absolute: 0 10*3/uL (ref 0.0–0.7)
Eosinophils Relative: 1 % (ref 0–5)
HEMATOCRIT: 43.6 % (ref 36.0–46.0)
Hemoglobin: 14.6 g/dL (ref 12.0–15.0)
LYMPHS PCT: 50 % — AB (ref 12–46)
Lymphs Abs: 2.8 10*3/uL (ref 0.7–4.0)
MCH: 28.6 pg (ref 26.0–34.0)
MCHC: 33.5 g/dL (ref 30.0–36.0)
MCV: 85.5 fL (ref 78.0–100.0)
MONO ABS: 0.4 10*3/uL (ref 0.1–1.0)
Monocytes Relative: 7 % (ref 3–12)
NEUTROS ABS: 2.2 10*3/uL (ref 1.7–7.7)
NEUTROS PCT: 40 % — AB (ref 43–77)
Platelets: 216 10*3/uL (ref 150–400)
RBC: 5.1 MIL/uL (ref 3.87–5.11)
RDW: 11.9 % (ref 11.5–15.5)
WBC: 5.5 10*3/uL (ref 4.0–10.5)

## 2014-02-25 LAB — COMPREHENSIVE METABOLIC PANEL
ALK PHOS: 73 U/L (ref 39–117)
ALT: 9 U/L (ref 0–35)
AST: 24 U/L (ref 0–37)
Albumin: 4.3 g/dL (ref 3.5–5.2)
Anion gap: 14 (ref 5–15)
BILIRUBIN TOTAL: 0.5 mg/dL (ref 0.3–1.2)
BUN: 8 mg/dL (ref 6–23)
CHLORIDE: 105 meq/L (ref 96–112)
CO2: 20 mEq/L (ref 19–32)
Calcium: 9 mg/dL (ref 8.4–10.5)
Creatinine, Ser: 0.66 mg/dL (ref 0.50–1.10)
GFR calc Af Amer: >90 mL/min (ref >90)
Glucose, Bld: 86 mg/dL (ref 70–99)
Potassium: 4.2 mEq/L (ref 3.7–5.3)
SODIUM: 139 meq/L (ref 137–147)
Total Protein: 7.8 g/dL (ref 6.0–8.3)

## 2014-02-25 LAB — URINALYSIS, ROUTINE W REFLEX MICROSCOPIC
Bilirubin Urine: NEGATIVE
Glucose, UA: NEGATIVE mg/dL
Hgb urine dipstick: NEGATIVE
Ketones, ur: NEGATIVE mg/dL
Leukocytes, UA: NEGATIVE
Nitrite: NEGATIVE
Protein, ur: NEGATIVE mg/dL
Specific Gravity, Urine: 1.013 (ref 1.005–1.030)
Urobilinogen, UA: 0.2 mg/dL (ref 0.0–1.0)
pH: 7 (ref 5.0–8.0)

## 2014-02-25 LAB — PREGNANCY, URINE: Preg Test, Ur: NEGATIVE

## 2014-02-25 LAB — LIPASE, BLOOD: Lipase: 31 U/L (ref 11–59)

## 2014-02-25 MED ORDER — IOHEXOL 300 MG/ML  SOLN
100.0000 mL | Freq: Once | INTRAMUSCULAR | Status: AC | PRN
  Administered 2014-02-25: 100 mL via INTRAVENOUS

## 2014-02-25 MED ORDER — ONDANSETRON HCL 4 MG/2ML IJ SOLN
4.0000 mg | Freq: Once | INTRAMUSCULAR | Status: DC
  Filled 2014-02-25: qty 2

## 2014-02-25 MED ORDER — IBUPROFEN 800 MG PO TABS: 800.0000 mg | ORAL_TABLET | Freq: Three times a day (TID) | ORAL | Status: DC | PRN

## 2014-02-25 MED ORDER — HYDROCODONE-ACETAMINOPHEN 5-325 MG PO TABS: 1.0000 | ORAL_TABLET | Freq: Four times a day (QID) | ORAL | Status: DC | PRN

## 2014-02-25 MED ORDER — FENTANYL CITRATE 0.05 MG/ML IJ SOLN
100.0000 ug | Freq: Once | INTRAMUSCULAR | Status: DC
  Filled 2014-02-25: qty 2

## 2014-02-25 MED ORDER — SODIUM CHLORIDE 0.9 % IV BOLUS (SEPSIS)
1000.0000 mL | Freq: Once | INTRAVENOUS | Status: AC
  Administered 2014-02-25: 1000 mL via INTRAVENOUS

## 2014-02-25 NOTE — Progress Notes (Signed)
Patient ID: Lori Alvarado, female   DOB: 1980-11-02, 33 y.o.   MRN: [id number] Presents with complaint of right lower abdominal pain that woke her up for the past 2 nights. Pain started in the middle of her abdomen and moved to the right lower side. Slight nausea without vomiting. States has been able to go to work, rates pain at 8 with movement 5 with rest. No relief of pain with Motrin or heat. Denies vaginal discharge, urinary symptoms, constipation, had BM. today and yesterday. Using no contraception, pregnancy okay, last cycle 2 weeks ago.  Exam: Appears well. Abdomen soft, pain with deep palpation to right lower quadrant mild rebound. No left-sided pain. Ultrasound transvaginal and transabdominal. Anteverted uterus homogeneous. Right ovary normal. Left ovary dominant follicle echo-free 25 x 25 mm avascular. Negative cul-de-sac. Right kidney no hydrocele was noted. Arterial blood flow bilateral ovaries. Large amount of bowel activity in the right lower quadrant noted.  Right lower quadrant pain  Plan: To [facility] for  evaluation - rule out appendicitis. MiraLAX when necessary.

## 2014-02-25 NOTE — Discharge Instructions (Signed)
Return here for any worsening in her condition.  Keep in mind that this could be an evolving process in her condition could change and would warrant a reevaluation in the emergency department.  Follow-up with her primary care doctor, increase your fluid intake

## 2014-02-25 NOTE — ED Notes (Addendum)
Pt seen by OBGYN today for lower right abdominal pain that is 8/10 and woke her up the past 2 night. Had negative ultrasound at the Dr today. Last BM today. Denies urinary issues. Denies n/v/d and fever. Pt had negative urine preg today as well.

## 2014-02-25 NOTE — ED Provider Notes (Signed)
CSN: [id number]     Arrival date & time 02/25/14  1339 History   First MD Initiated Contact with Patient 02/25/14 1531     Chief Complaint  Patient presents with  . Abdominal Pain     (Consider location/radiation/quality/duration/timing/severity/associated sxs/prior Treatment) HPI Patient presents to the emergency department with mid to right lower abdominal pain that started yesterday.  The patient states that it started around 1:30 in the morning on Monday.  She awoke with pain.  Patient denies chest pain, shortness of breath, nausea, vomiting, diarrhea, fever, weakness, dizziness, headache, blurred vision, neck pain, neck pain, rash, lightheadedness, dysuria, vaginal bleeding, vaginal discharge or syncope.  The patient states that she was seen by her OB/GYN today and they did an ultrasound and did not see any significant abnormalities.  She was sent here for further evaluation and CT scan of her abdomen.  Patient states that she did not take any medications prior to arrival.  Patient states that standing and walking makes the pain worse. Past Medical History  Diagnosis Date  . HSV-1 (herpes simplex virus 1) infection   . Seizure disorder   . 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 Migraine headache    Past Surgical History  Procedure Laterality Date  . No past surgeries     Family History  Problem Relation Age of Onset  . Hypertension Maternal Grandmother   . Heart disease Maternal Grandmother   . Cancer Cousin 21    DIED WITH LIVER/LUNG CANCER  . Hypertension Maternal Aunt   . Seizures Maternal Aunt    History  Substance Use Topics  . Smoking status: Never Smoker   . Smokeless tobacco: Never Used  . Alcohol Use: Yes     Comment: occ   OB History   Grav Para Term Preterm Abortions TAB SAB Ect Mult Living   1 1 1       1      Review of Systems All other systems negative except as documented in the HPI. All pertinent positives and negatives as  reviewed in the HPI.    Allergies  Lamictal  Home Medications   Prior to Admission medications   Medication Sig Start Date End Date Taking? Authorizing Provider  folic acid (FOLVITE) 1 MG tablet Take 2 mg by mouth daily.   Yes Historical Provider, MD  levETIRAcetam (KEPPRA) 750 MG tablet Take 1 tablet (750 mg total) by mouth 2 (two) times daily. 09/19/13  Yes Kathrynn Ducking, MD  topiramate (TOPAMAX) 25 MG tablet Take 100 mg by mouth at bedtime.   Yes Historical Provider, MD   BP 128/74  Pulse 75  Temp(Src) 98.7 F (37.1 C) (Oral)  Resp 18  SpO2 99%  LMP 02/11/2014  Breastfeeding? No Physical Exam  Nursing note and vitals reviewed. Constitutional: She is oriented to person, place, and time. She appears well-developed and well-nourished. No distress.  HENT:  Head: Normocephalic and atraumatic.  Mouth/Throat: Oropharynx is clear and moist.  Eyes: Pupils are equal, round, and reactive to light.  Neck: Normal range of motion. Neck supple.  Cardiovascular: Normal rate, regular rhythm and normal heart sounds.  Exam reveals no gallop and no friction rub.   No murmur heard. Pulmonary/Chest: Effort normal and breath sounds normal. No respiratory distress.  Abdominal: Soft. Normal appearance and bowel sounds are normal. She exhibits no distension. There is tenderness. There is no rigidity, no rebound and no guarding.    Neurological: She is alert and oriented  to person, place, and time. She exhibits normal muscle tone. Coordination normal.  Skin: Skin is warm and dry. No rash noted. No erythema.    ED Course  Procedures (including critical care time) Labs Review Labs Reviewed  CBC WITH DIFFERENTIAL - Abnormal; Notable for the following:    Neutrophils Relative % 40 (*)    Lymphocytes Relative 50 (*)    Basophils Relative 2 (*)    All other components within normal limits  COMPREHENSIVE METABOLIC PANEL  URINALYSIS, ROUTINE W REFLEX MICROSCOPIC  PREGNANCY, URINE  LIPASE,  BLOOD    Imaging Review Ct Abdomen Pelvis W Contrast  02/25/2014   CLINICAL DATA:  Right lower abdominal pain for the past 2 days  EXAM: CT ABDOMEN AND PELVIS WITH CONTRAST  TECHNIQUE: Multidetector CT imaging of the abdomen and pelvis was performed using the standard protocol following bolus administration of intravenous contrast.  CONTRAST:  111mL OMNIPAQUE IOHEXOL 300 MG/ML  SOLN  COMPARISON:  None.  FINDINGS: The lung bases are free of acute infiltrate or sizable effusion.  The liver, gallbladder, spleen, adrenal glands and pancreas are within normal limits. The kidneys are within normal limits bilaterally. No obstructive changes are seen. The appendix is well visualized and within normal limits.  The bladder is well distended. The uterus and ovaries are well visualized with multiple bilateral ovarian cysts. The largest of these is noted on the left measuring 2 cm in dimension. No free pelvic fluid is seen. No significant lymphadenopathy is identified. No acute bony abnormality is noted.  IMPRESSION: Ovarian cystic change.  Normal-appearing appendix.  No acute abnormality is noted.   Electronically Signed   By: Inez Catalina M.D.   On: 02/25/2014 18:11   Patient is explained that her CT scan was negative here today, but this could be an evolving process and that she will need to monitor for any worsening or changes in her condition.  The patient agrees to this plan and QUESTIONS were answered.  Did advise her to follow with her primary care doctor.  She was given strict return precautions.  Patient's abdomen was reexamined and she does still have some mild discomfort in the right mid to lower abdomen.  The CT scan did show some cystic changes of the ovaries and I did explain that this could be a possible source for pain, but to continue to monitor her symptoms MDM   Final diagnoses:  Abdominal pain       Brent General, PA-C 02/25/14 1851

## 2014-02-26 NOTE — ED Provider Notes (Signed)
  This was a shared visit with a mid-level provided (NP or PA).  Throughout the patient's course I was available for consultation/collaboration.  I saw the ECG (if appropriate), relevant labs and studies - I agree with the interpretation.  On my exam the patient was in no distress.  She was awake and alert, speaking clearly, and we had a lengthy conversation about her ultrasound, CT results. She voiced an understanding of return precautions, follow-up instructions, and she was discharged in stable condition.      Carmin Muskrat, MD 02/26/14 [phone]

## 2014-03-03 ENCOUNTER — Encounter (HOSPITAL_COMMUNITY): Payer: Self-pay | Admitting: Emergency Medicine

## 2014-03-11 ENCOUNTER — Other Ambulatory Visit: Payer: Self-pay | Admitting: Neurology

## 2014-03-24 ENCOUNTER — Ambulatory Visit: Payer: 59 | Admitting: Adult Health

## 2014-05-18 ENCOUNTER — Other Ambulatory Visit: Payer: Self-pay | Admitting: Neurology

## 2014-05-29 ENCOUNTER — Encounter: Payer: Self-pay | Admitting: Adult Health

## 2014-05-29 ENCOUNTER — Ambulatory Visit (INDEPENDENT_AMBULATORY_CARE_PROVIDER_SITE_OTHER): Payer: 59 | Admitting: Adult Health

## 2014-05-29 VITALS — BP 107/66 | HR 81 | Ht 61.0 in | Wt 144.0 lb

## 2014-05-29 DIAGNOSIS — R51 Headache: Secondary | ICD-10-CM

## 2014-05-29 DIAGNOSIS — R519 Headache, unspecified: Secondary | ICD-10-CM

## 2014-05-29 DIAGNOSIS — G40909 Epilepsy, unspecified, not intractable, without status epilepticus: Secondary | ICD-10-CM

## 2014-05-29 MED ORDER — TOPIRAMATE 25 MG PO TABS: ORAL_TABLET | ORAL | Status: DC

## 2014-05-29 NOTE — Progress Notes (Signed)
PATIENT: Lori Alvarado DOB: 08/27/1980  REASON FOR VISIT: follow up- seizures and headache  HISTORY FROM: patient  HISTORY OF PRESENT ILLNESS: Lori Alvarado is a 34 year old female with a history of Ice pick headaches and seizures. She is currently taking Keppra 750 mg twice a day. She denies any recent seizure event. She operates a Teacher, music without difficulty. The patient is also taking Topamax 100 mg at bedtime for ice pick headaches. She reports that her headaches have improved. She states that she still will have them daily or every other day. She states that they are tolerable. The ice pick pain only last for seconds and then resolve. Denies any additional symptoms with the ice pick pains. No new medical history since last seen.   HISTORY 09/19/13 (WILLIS): Lori Alvarado is a 34 year old right-handed white female with a history of migraine headaches and seizures. She has done well on Keppra taking 750 mg twice daily. She has very rare migraine headaches, but she has frequent ice pick pains that occur daily in nature. The patient is sleeping better at night as her child has gotten older. The patient has not noted any new medical issues that have come up since last seen. She is tolerating the Keppra well. In the past, she has noted that Topamax has helped ice pick pain frequency.  REVIEW OF SYSTEMS: Out of a complete 14 system review of symptoms, the patient complains only of the following symptoms, and all other reviewed systems are negative.  Ringing in ears  ALLERGIES: Allergies  Allergen Reactions  . Lamictal [Lamotrigine] Other (See Comments)    Made her feel crazy    HOME MEDICATIONS: Outpatient Prescriptions Prior to Visit  Medication Sig Dispense Refill  . folic acid (FOLVITE) 1 MG tablet Take 2 mg by mouth daily.    Marland Kitchen levETIRAcetam (KEPPRA) 750 MG tablet Take 1 tablet (750 mg total) by mouth 2 (two) times daily. 60 tablet 6  . levETIRAcetam (KEPPRA) 750 MG tablet  TAKE 1 TABLET EVERY 12 HOURS 180 tablet 0  . topiramate (TOPAMAX) 25 MG tablet Take 100 mg by mouth at bedtime.    Marland Kitchen HYDROcodone-acetaminophen (NORCO/VICODIN) 5-325 MG per tablet Take 1 tablet by mouth every 6 (six) hours as needed for moderate pain. (Patient not taking: Reported on 05/29/2014) 15 tablet 0  . ibuprofen (ADVIL,MOTRIN) 800 MG tablet Take 1 tablet (800 mg total) by mouth every 8 (eight) hours as needed. (Patient not taking: Reported on 05/29/2014) 21 tablet 0   No facility-administered medications prior to visit.    PAST MEDICAL HISTORY: Past Medical History  Diagnosis Date  . HSV-1 (herpes simplex virus 1) infection   . Seizure disorder   . 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 Migraine headache     PAST SURGICAL HISTORY: Past Surgical History  Procedure Laterality Date  . No past surgeries      FAMILY HISTORY: Family History  Problem Relation Age of Onset  . Hypertension Maternal Grandmother   . Heart disease Maternal Grandmother   . Cancer Cousin 21    DIED WITH LIVER/LUNG CANCER  . Hypertension Maternal Aunt   . Seizures Maternal Aunt     SOCIAL HISTORY:     PHYSICAL EXAM  Filed Vitals:   05/29/14 0822  BP: 107/66  Pulse: 81  Height: 5\' 1"  (1.549 m)  Weight: 144 lb (65.318 kg)   Body mass index is 27.22 kg/(m^2).  Generalized: Well developed, in no acute  distress   Neurological examination  Mentation: Alert oriented to time, place, history taking. Follows all commands speech and language fluent Cranial nerve II-XII: Pupils were equal round reactive to light. Extraocular movements were full, visual field were full on confrontational test. Facial sensation and strength were normal. Uvula tongue midline. Head turning and shoulder shrug  were normal and symmetric. Motor: The motor testing reveals 5 over 5 strength of all 4 extremities. Good symmetric motor tone is noted throughout.  Sensory: Sensory testing is intact to  soft touch on all 4 extremities. No evidence of extinction is noted.  Coordination: Cerebellar testing reveals good finger-nose-finger and heel-to-shin bilaterally.  Gait and station: Gait is normal. Tandem gait is normal. Romberg is negative. No drift is seen.  Reflexes: Deep tendon reflexes are symmetric and normal bilaterally.    DIAGNOSTIC DATA (LABS, IMAGING, TESTING) - I reviewed patient records, labs, notes, testing and imaging myself where available.  Lab Results  Component Value Date   WBC 5.5 02/25/2014   HGB 14.6 02/25/2014   HCT 43.6 02/25/2014   MCV 85.5 02/25/2014   PLT 216 02/25/2014      Component Value Date/Time   NA 139 02/25/2014 1545   K 4.2 02/25/2014 1545   CL 105 02/25/2014 1545   CO2 20 02/25/2014 1545   GLUCOSE 86 02/25/2014 1545   BUN 8 02/25/2014 1545   CREATININE 0.66 02/25/2014 1545   CALCIUM 9.0 02/25/2014 1545   PROT 7.8 02/25/2014 1545   ALBUMIN 4.3 02/25/2014 1545   AST 24 02/25/2014 1545   ALT 9 02/25/2014 1545   ALKPHOS 73 02/25/2014 1545   BILITOT 0.5 02/25/2014 1545   GFRNONAA >90 02/25/2014 1545   GFRAA >90 02/25/2014 1545   Lab Results  Component Value Date   CHOL 139 06/29/2012   HDL 56 06/29/2012   LDLCALC 73 06/29/2012   TRIG 50 06/29/2012   CHOLHDL 2.5 06/29/2012      ASSESSMENT AND PLAN 34 y.o. year old female  has a past medical history of HSV-1 (herpes simplex virus 1) infection; Seizure disorder; Telangiectasia macularis eruptiva perstans (10/2010); and Migraine headache. here with:  1. Seizures 2. Ice Pick headache  Patient continues to have ice pick pains daily or every other day. We will increase Topamax to 1 tablet in the morning and 4 tablets at bedtime. If she is unable to tolerate the morning dose she will let us know. Patient does plan to try to get pregnant at the end of the year. She will let us know when they plan to try to conceive and Topamax will be weaned off. If her symptoms worsen or she develops new  symptoms she will let us know.  She will follow-up in 6 months or sooner if needed.    Ward Givens, MSN, NP-C 05/29/2014, 8:29 AM [facility] [address], [place], [place] [address] [phone]  Note: This document was prepared with digital dictation and possible smart phrase technology. Any transcriptional errors that result from this process are unintentional.

## 2014-05-29 NOTE — Patient Instructions (Signed)
Increase Topamax to 1 tablet in the morning and 4 tablets before bed.  If unable to tolerate the morning dose please let us know. When you start trying to conceive please let us know so the medication can be weaned off.

## 2014-05-29 NOTE — Progress Notes (Signed)
I have read the note, and I agree with the clinical assessment and plan.  Zaydyn Havey KEITH   

## 2014-06-26 ENCOUNTER — Encounter: Payer: Self-pay | Admitting: Women's Health

## 2014-07-03 ENCOUNTER — Other Ambulatory Visit (HOSPITAL_COMMUNITY)
Admission: RE | Admit: 2014-07-03 | Discharge: 2014-07-03 | Disposition: A | Discharge status: Home / Self Care | Payer: 59 | Source: Ambulatory Visit | Attending: Women's Health | Admitting: Women's Health

## 2014-07-03 ENCOUNTER — Encounter: Payer: Self-pay | Admitting: Women's Health

## 2014-07-03 ENCOUNTER — Ambulatory Visit (INDEPENDENT_AMBULATORY_CARE_PROVIDER_SITE_OTHER): Payer: 59 | Admitting: Women's Health

## 2014-07-03 VITALS — BP 110/80 | Ht 60.0 in | Wt 140.0 lb

## 2014-07-03 DIAGNOSIS — Z1151 Encounter for screening for human papillomavirus (HPV): Secondary | ICD-10-CM | POA: Diagnosis present

## 2014-07-03 DIAGNOSIS — Z01419 Encounter for gynecological examination (general) (routine) without abnormal findings: Secondary | ICD-10-CM

## 2014-07-03 NOTE — Patient Instructions (Signed)

## 2014-07-03 NOTE — Progress Notes (Signed)
Samar Venneman Dolder Sep 21, 1980 [id number]    History:    Presents for annual exam.  Monthly cycle using no contraception pregnancy okay. History of HSV 1 with no outbreaks. Gardasil series completed. Normal Pap history. History of a seizure disorder on Keppra no seizures.  Past medical history, past surgical history, family history and social history were all reviewed and documented in the EPIC chart. Works at [employer] in [place]. Son is 18 months and thriving.  ROS:  A ROS was performed and pertinent positives and negatives are included.  Exam:  Filed Vitals:   07/03/14 0825  BP: 110/80    General appearance:  Normal Thyroid:  Symmetrical, normal in size, without palpable masses or nodularity. Respiratory  Auscultation:  Clear without wheezing or rhonchi Cardiovascular  Auscultation:  Regular rate, without rubs, murmurs or gallops  Edema/varicosities:  Not grossly evident Abdominal  Soft,nontender, without masses, guarding or rebound.  Liver/spleen:  No organomegaly noted  Hernia:  None appreciated  Skin  Inspection:  Grossly normal   Breasts: Examined lying and sitting.     Right: Without masses, retractions, discharge or axillary adenopathy.     Left: Without masses, retractions, discharge or axillary adenopathy. Gentitourinary   Inguinal/mons:  Normal without inguinal adenopathy  External genitalia:  Normal  BUS/Urethra/Skene's glands:  Normal  Vagina:  Normal  Cervix:  Normal  Uterus:  normal in size, shape and contour.  Midline and mobile  Adnexa/parametria:     Rt: Without masses or tenderness.   Lt: Without masses or tenderness.  Anus and perineum: Normal  Digital rectal exam: Normal sphincter tone without palpated masses or tenderness  Assessment/Plan:  34 y.o. MWF G1 P1 for annual exam with no complaints.  Regular monthly cycle/no contraception pregnancy okay HSV 1 history no outbreaks Seizure disorder-neurologist manages meds  Plan: Biometric labs at work  will send copy of report. SBE's, continue regular exercise, healthy lifestyle, MVI daily encouraged. UA, Pap with HR HPV typing, new screening guidelines reviewed. Safe pregnancy behaviors reviewed, return to office with missed cycle for viability ultrasound.  Huel Cote WHNP, 9:08 AM 07/03/2014

## 2014-07-04 LAB — URINALYSIS W MICROSCOPIC + REFLEX CULTURE
BILIRUBIN URINE: NEGATIVE
CASTS: NONE SEEN
CRYSTALS: NONE SEEN
GLUCOSE, UA: NEGATIVE mg/dL
Hgb urine dipstick: NEGATIVE
KETONES UR: NEGATIVE mg/dL
Nitrite: NEGATIVE
PH: 7.5 (ref 5.0–8.0)
Protein, ur: NEGATIVE mg/dL
SPECIFIC GRAVITY, URINE: 1.017 (ref 1.005–1.030)
SQUAMOUS EPITHELIAL / LPF: NONE SEEN
Urobilinogen, UA: 1 mg/dL (ref 0.0–1.0)

## 2014-07-04 LAB — CYTOLOGY - PAP

## 2014-07-05 LAB — URINE CULTURE
Colony Count: NO GROWTH
Organism ID, Bacteria: NO GROWTH

## 2014-07-09 ENCOUNTER — Telehealth: Payer: Self-pay | Admitting: Neurology

## 2014-07-09 MED ORDER — TOPIRAMATE 25 MG PO TABS: ORAL_TABLET | ORAL | Status: DC

## 2014-07-09 NOTE — Telephone Encounter (Signed)
Patient is calling to see if she can have her Rx Topamax 25 mg refilled. She is going out of town and would like to use [facility] at  [employer] [facility] [employer].  Please call.

## 2014-07-09 NOTE — Telephone Encounter (Signed)
Rx has been sent.  I called back.  Got no answer.  Left message.

## 2014-10-02 ENCOUNTER — Other Ambulatory Visit: Payer: Self-pay | Admitting: Neurology

## 2014-12-01 ENCOUNTER — Ambulatory Visit: Payer: 59 | Admitting: Adult Health

## 2014-12-09 ENCOUNTER — Other Ambulatory Visit: Payer: Self-pay | Admitting: Women's Health

## 2015-01-21 ENCOUNTER — Other Ambulatory Visit: Payer: Self-pay | Admitting: Neurology

## 2015-04-19 ENCOUNTER — Other Ambulatory Visit: Payer: Self-pay | Admitting: Neurology

## 2015-05-05 ENCOUNTER — Encounter: Payer: Self-pay | Admitting: Adult Health

## 2015-05-05 ENCOUNTER — Ambulatory Visit (INDEPENDENT_AMBULATORY_CARE_PROVIDER_SITE_OTHER): Payer: 59 | Admitting: Adult Health

## 2015-05-05 VITALS — BP 111/79 | HR 70 | Ht 61.0 in | Wt 144.5 lb

## 2015-05-05 DIAGNOSIS — R519 Headache, unspecified: Secondary | ICD-10-CM

## 2015-05-05 DIAGNOSIS — G40909 Epilepsy, unspecified, not intractable, without status epilepticus: Secondary | ICD-10-CM | POA: Diagnosis not present

## 2015-05-05 DIAGNOSIS — R51 Headache: Secondary | ICD-10-CM

## 2015-05-05 MED ORDER — LEVETIRACETAM 750 MG PO TABS: 750.0000 mg | ORAL_TABLET | Freq: Two times a day (BID) | ORAL | Status: DC

## 2015-05-05 NOTE — Progress Notes (Signed)
I have read the note, and I agree with the clinical assessment and plan.  Wandell Scullion KEITH   

## 2015-05-05 NOTE — Progress Notes (Signed)
PATIENT: Lori Alvarado DOB: 03/29/1981  REASON FOR VISIT: follow up- seizures, headache HISTORY FROM: patient  HISTORY OF PRESENT ILLNESS: Lori Alvarado is a 35 year old female with a history of ice pick headaches and seizures. She returns today for follow-up. She continues to take Keppra 750 mg twice a day. She denies any seizure events. She is able to complete all ADLs independently. She operates a Teacher, music without difficulty. The patient reports that she is no longer on Topamax. She states that her and her husband are considering pregnancy. At this time they're not taking any precautions to prevent pregnancy. The patient reports that her headaches are tolerable at this point. She denies any new symptoms. She returns today for an evaluation.  HISTORY 05/29/14: Lori Alvarado is a 35 year old female with a history of Ice pick headaches and seizures. She is currently taking Keppra 750 mg twice a day. She denies any recent seizure event. She operates a Teacher, music without difficulty. The patient is also taking Topamax 100 mg at bedtime for ice pick headaches. She reports that her headaches have improved. She states that she still will have them daily or every other day. She states that they are tolerable. The ice pick pain only last for seconds and then resolve. Denies any additional symptoms with the ice pick pains. No new medical history since last seen.   HISTORY 09/19/13 (WILLIS): Lori Alvarado is a 35 year old right-handed white female with a history of migraine headaches and seizures. She has done well on Keppra taking 750 mg twice daily. She has very rare migraine headaches, but she has frequent ice pick pains that occur daily in nature. The patient is sleeping better at night as her child has gotten older. The patient has not noted any new medical issues that have come up since last seen. She is tolerating the Keppra well. In the past, she has noted that Topamax has helped ice pick  pain frequency.  REVIEW OF SYSTEMS: Out of a complete 14 system review of symptoms, the patient complains only of the following symptoms, and all other reviewed systems are negative.  See history of present illness  ALLERGIES: Allergies  Allergen Reactions  . Lamictal [Lamotrigine] Other (See Comments)    Made her feel crazy    HOME MEDICATIONS: Outpatient Prescriptions Prior to Visit  Medication Sig Dispense Refill  . folic acid (FOLVITE) 1 MG tablet Take 2 mg by mouth daily.    Marland Kitchen levETIRAcetam (KEPPRA) 750 MG tablet TAKE 1 TABLET EVERY 12 HOURS 180 tablet 0  . valACYclovir (VALTREX) 500 MG tablet TAKE TWICE DAILY FOR 3 TO 5 DAYS (Patient not taking: Reported on 05/05/2015) 30 tablet 6  . topiramate (TOPAMAX) 25 MG tablet Take 1 tablet PO in the AM and 4 tablets PO in the PM. 150 tablet 5   No facility-administered medications prior to visit.    PAST MEDICAL HISTORY: Past Medical History  Diagnosis Date  . HSV-1 (herpes simplex virus 1) infection   . Seizure disorder ([place])   . Migraine headache     PAST SURGICAL HISTORY: Past Surgical History  Procedure Laterality Date  . No past surgeries      FAMILY HISTORY: Family History  Problem Relation Age of Onset  . Hypertension Maternal Grandmother   . Heart disease Maternal Grandmother   . Cancer Cousin 21    DIED WITH LIVER/LUNG CANCER  . Hypertension Maternal Aunt   . Seizures Maternal Aunt     SOCIAL HISTORY: Social  History   Social History  . Marital Status: Married    Spouse Name: N/A  . Number of Children: 1  . Years of Education: college   Occupational History  .  [employer]   Social History Main Topics  . Smoking status: Never Smoker   . Smokeless tobacco: Never Used  . Alcohol Use: 0.0 oz/week    0 Standard drinks or equivalent per week     Comment: occ  . Drug Use: No  . Sexual Activity:    Partners: Male    Patent examiner Protection: None     Comment: INTERCOURSE AGE 35, EXUAL PARTNERS  LESS TRHAN 5   Other Topics Concern  . Not on file   Social History Narrative      PHYSICAL EXAM  Filed Vitals:   05/05/15 0930  BP: 111/79  Pulse: 70  Height: 5\' 1"  (1.549 m)  Weight: 144 lb 8 oz (65.545 kg)   Body mass index is 27.32 kg/(m^2).  Generalized: Well developed, in no acute distress   Neurological examination  Mentation: Alert oriented to time, place, history taking. Follows all commands speech and language fluent Cranial nerve II-XII: Pupils were equal round reactive to light. Extraocular movements were full, visual field were full on confrontational test. Facial sensation and strength were normal. Uvula tongue midline. Head turning and shoulder shrug  were normal and symmetric. Motor: The motor testing reveals 5 over 5 strength of all 4 extremities. Good symmetric motor tone is noted throughout.  Sensory: Sensory testing is intact to soft touch on all 4 extremities. No evidence of extinction is noted.  Coordination: Cerebellar testing reveals good finger-nose-finger and heel-to-shin bilaterally.  Gait and station: Gait is normal. Tandem gait is normal. Romberg is negative. No drift is seen.  Reflexes: Deep tendon reflexes are symmetric and normal bilaterally.   DIAGNOSTIC DATA (LABS, IMAGING, TESTING) - I reviewed patient records, labs, notes, testing and imaging myself where available.  Lab Results  Component Value Date   WBC 5.5 02/25/2014   HGB 14.6 02/25/2014   HCT 43.6 02/25/2014   MCV 85.5 02/25/2014   PLT 216 02/25/2014      Component Value Date/Time   NA 139 02/25/2014 1545   K 4.2 02/25/2014 1545   CL 105 02/25/2014 1545   CO2 20 02/25/2014 1545   GLUCOSE 86 02/25/2014 1545   BUN 8 02/25/2014 1545   CREATININE 0.66 02/25/2014 1545   CALCIUM 9.0 02/25/2014 1545   PROT 7.8 02/25/2014 1545   ALBUMIN 4.3 02/25/2014 1545   AST 24 02/25/2014 1545   ALT 9 02/25/2014 1545   ALKPHOS 73 02/25/2014 1545   BILITOT 0.5 02/25/2014 1545   GFRNONAA  >90 02/25/2014 1545   GFRAA >90 02/25/2014 1545       ASSESSMENT AND PLAN 35 y.o. year old female  has a past medical history of HSV-1 (herpes simplex virus 1) infection; Seizure disorder ([place]); and Migraine headache. here with:  1. Seizure disorder 2. Headache  Overall the patient is doing well. She will continue on Keppra 750 mg twice a day. Refill has been sent. Patient advised that she has any seizure event she should let us know. Patient also advised that if her headache severity and frequency increases she should let us know. She will follow-up in one year or sooner if needed.     Ward Givens, MSN, NP-C 05/05/2015, 9:51 AM [facility] [address] [place], [place] [address] [phone]

## 2015-05-05 NOTE — Patient Instructions (Signed)
Continue Keppra.  If your symptoms worsen or you develop new symptoms please let us know.   

## 2015-07-08 ENCOUNTER — Encounter: Payer: Self-pay | Admitting: Women's Health

## 2015-07-08 ENCOUNTER — Ambulatory Visit (INDEPENDENT_AMBULATORY_CARE_PROVIDER_SITE_OTHER): Payer: 59 | Admitting: Women's Health

## 2015-07-08 VITALS — BP 122/74 | Ht 61.0 in | Wt 146.0 lb

## 2015-07-08 DIAGNOSIS — B009 Herpesviral infection, unspecified: Secondary | ICD-10-CM | POA: Diagnosis not present

## 2015-07-08 DIAGNOSIS — Z01419 Encounter for gynecological examination (general) (routine) without abnormal findings: Secondary | ICD-10-CM

## 2015-07-08 MED ORDER — VALACYCLOVIR HCL 500 MG PO TABS: ORAL_TABLET | ORAL | Status: DC

## 2015-07-08 NOTE — Progress Notes (Signed)
Lori Alvarado 1980-09-07 [id number]    History:    Presents for annual exam. Regular monthly cycle using no contraception pregnancy okay. History of HSV-1 rare outbreaks. Normal Pap history. Gardasil series completed. History of seizures been seizure-free for greater than 5 years on Keppra per neurologist.  Past medical history, past surgical history, family history and social history were all reviewed and documented in the EPIC chart. Works for [employer]. Son 2-1/2 doing well.  ROS:  A ROS was performed and pertinent positives and negatives are included.  Exam:  Filed Vitals:   07/08/15 0826  BP: 122/74    General appearance:  Normal Thyroid:  Symmetrical, normal in size, without palpable masses or nodularity. Respiratory  Auscultation:  Clear without wheezing or rhonchi Cardiovascular  Auscultation:  Regular rate, without rubs, murmurs or gallops  Edema/varicosities:  Not grossly evident Abdominal  Soft,nontender, without masses, guarding or rebound.  Liver/spleen:  No organomegaly noted  Hernia:  None appreciated  Skin  Inspection:  Grossly normal   Breasts: Examined lying and sitting.     Right: Without masses, retractions, discharge or axillary adenopathy.     Left: Without masses, retractions, discharge or axillary adenopathy. Gentitourinary   Inguinal/mons:  Normal without inguinal adenopathy  External genitalia:  Normal  BUS/Urethra/Skene's glands:  Normal  Vagina:  Normal  Cervix:  Normal  Uterus:  normal in size, shape and contour.  Midline and mobile  Adnexa/parametria:     Rt: Without masses or tenderness.   Lt: Without masses or tenderness.  Anus and perineum: Normal  Digital rectal exam: Normal sphincter tone without palpated masses or tenderness  Assessment/Plan:  35 y.o. M WF G1P1 for annual exam with no complaints.  Monthly cycle using no contraception pregnancy okay History of seizures on Keppra seizure-free greater than 5 years/neurologist HSV-1-rare  outbreaks  Plan: Ovulation prediction reviewed, return to office with missed cycle, aware we no longer deliver. SBE's, exercise, calcium rich diet, prenatal vitamin daily encouraged. Aware of safe pregnancy behaviors. UA, Pap normal with negative HR HPV 2016, new screening guidelines reviewed.  Valtrex 500 twice daily for 3-5 days as needed prescription, proper use given and reviewed. Instructed to have health screening labs from work faxed to office.  Huel Cote [facility], 8:47 AM 07/08/2015

## 2015-07-08 NOTE — Patient Instructions (Signed)
Health Maintenance, Female Adopting a healthy lifestyle and getting preventive care can go a long way to promote health and wellness. Talk with your health care provider about what schedule of regular examinations is right for you. This is a good chance for you to check in with your provider about disease prevention and staying healthy. In between checkups, there are plenty of things you can do on your own. Experts have done a lot of research about which lifestyle changes and preventive measures are most likely to keep you healthy. Ask your health care provider for more information. WEIGHT AND DIET  Eat a healthy diet  Be sure to include plenty of vegetables, fruits, low-fat dairy products, and lean protein.  Do not eat a lot of foods high in solid fats, added sugars, or salt.  Get regular exercise. This is one of the most important things you can do for your health.  Most adults should exercise for at least 150 minutes each week. The exercise should increase your heart rate and make you sweat (moderate-intensity exercise).  Most adults should also do strengthening exercises at least twice a week. This is in addition to the moderate-intensity exercise.  Maintain a healthy weight  Body mass index (BMI) is a measurement that can be used to identify possible weight problems. It estimates body fat based on height and weight. Your health care provider can help determine your BMI and help you achieve or maintain a healthy weight.  For females 20 years of age and older:   A BMI below 18.5 is considered underweight.  A BMI of 18.5 to 24.9 is normal.  A BMI of 25 to 29.9 is considered overweight.  A BMI of 30 and above is considered obese.  Watch levels of cholesterol and blood lipids  You should start having your blood tested for lipids and cholesterol at 34 years of age, then have this test every 5 years.  You may need to have your cholesterol levels checked more often if:  Your lipid  or cholesterol levels are high.  You are older than 34 years of age.  You are at high risk for heart disease.  CANCER SCREENING   Lung Cancer  Lung cancer screening is recommended for adults 55-80 years old who are at high risk for lung cancer because of a history of smoking.  A yearly low-dose CT scan of the lungs is recommended for people who:  Currently smoke.  Have quit within the past 15 years.  Have at least a 30-pack-year history of smoking. A pack year is smoking an average of one pack of cigarettes a day for 1 year.  Yearly screening should continue until it has been 15 years since you quit.  Yearly screening should stop if you develop a health problem that would prevent you from having lung cancer treatment.  Breast Cancer  Practice breast self-awareness. This means understanding how your breasts normally appear and feel.  It also means doing regular breast self-exams. Let your health care provider know about any changes, no matter how small.  If you are in your 20s or 30s, you should have a clinical breast exam (CBE) by a health care provider every 1-3 years as part of a regular health exam.  If you are 40 or older, have a CBE every year. Also consider having a breast X-ray (mammogram) every year.  If you have a family history of breast cancer, talk to your health care provider about genetic screening.  If you   are at high risk for breast cancer, talk to your health care provider about having an MRI and a mammogram every year.  Breast cancer gene (BRCA) assessment is recommended for women who have family members with BRCA-related cancers. BRCA-related cancers include:  Breast.  Ovarian.  Tubal.  Peritoneal cancers.  Results of the assessment will determine the need for genetic counseling and BRCA1 and BRCA2 testing. Cervical Cancer Your health care provider may recommend that you be screened regularly for cancer of the pelvic organs (ovaries, uterus, and  vagina). This screening involves a pelvic examination, including checking for microscopic changes to the surface of your cervix (Pap test). You may be encouraged to have this screening done every 3 years, beginning at age 21.  For women ages 30-65, health care providers may recommend pelvic exams and Pap testing every 3 years, or they may recommend the Pap and pelvic exam, combined with testing for human papilloma virus (HPV), every 5 years. Some types of HPV increase your risk of cervical cancer. Testing for HPV may also be done on women of any age with unclear Pap test results.  Other health care providers may not recommend any screening for nonpregnant women who are considered low risk for pelvic cancer and who do not have symptoms. Ask your health care provider if a screening pelvic exam is right for you.  If you have had past treatment for cervical cancer or a condition that could lead to cancer, you need Pap tests and screening for cancer for at least 20 years after your treatment. If Pap tests have been discontinued, your risk factors (such as having a new sexual partner) need to be reassessed to determine if screening should resume. Some women have medical problems that increase the chance of getting cervical cancer. In these cases, your health care provider may recommend more frequent screening and Pap tests. Colorectal Cancer  This type of cancer can be detected and often prevented.  Routine colorectal cancer screening usually begins at 34 years of age and continues through 34 years of age.  Your health care provider may recommend screening at an earlier age if you have risk factors for colon cancer.  Your health care provider may also recommend using home test kits to check for hidden blood in the stool.  A small camera at the end of a tube can be used to examine your colon directly (sigmoidoscopy or colonoscopy). This is done to check for the earliest forms of colorectal  cancer.  Routine screening usually begins at age 50.  Direct examination of the colon should be repeated every 5-10 years through 34 years of age. However, you may need to be screened more often if early forms of precancerous polyps or small growths are found. Skin Cancer  Check your skin from head to toe regularly.  Tell your health care provider about any new moles or changes in moles, especially if there is a change in a mole's shape or color.  Also tell your health care provider if you have a mole that is larger than the size of a pencil eraser.  Always use sunscreen. Apply sunscreen liberally and repeatedly throughout the day.  Protect yourself by wearing long sleeves, pants, a wide-brimmed hat, and sunglasses whenever you are outside. HEART DISEASE, DIABETES, AND HIGH BLOOD PRESSURE   High blood pressure causes heart disease and increases the risk of stroke. High blood pressure is more likely to develop in:  People who have blood pressure in the high end   of the normal range (130-139/85-89 mm Hg).  People who are overweight or obese.  People who are African American.  If you are 38-23 years of age, have your blood pressure checked every 3-5 years. If you are 61 years of age or older, have your blood pressure checked every year. You should have your blood pressure measured twice--once when you are at a hospital or clinic, and once when you are not at a hospital or clinic. Record the average of the two measurements. To check your blood pressure when you are not at a hospital or clinic, you can use:  An automated blood pressure machine at a pharmacy.  A home blood pressure monitor.  If you are between 45 years and 39 years old, ask your health care provider if you should take aspirin to prevent strokes.  Have regular diabetes screenings. This involves taking a blood sample to check your fasting blood sugar level.  If you are at a normal weight and have a low risk for diabetes,  have this test once every three years after 34 years of age.  If you are overweight and have a high risk for diabetes, consider being tested at a younger age or more often. PREVENTING INFECTION  Hepatitis B  If you have a higher risk for hepatitis B, you should be screened for this virus. You are considered at high risk for hepatitis B if:  You were born in a country where hepatitis B is common. Ask your health care provider which countries are considered high risk.  Your parents were born in a high-risk country, and you have not been immunized against hepatitis B (hepatitis B vaccine).  You have HIV or AIDS.  You use needles to inject street drugs.  You live with someone who has hepatitis B.  You have had sex with someone who has hepatitis B.  You get hemodialysis treatment.  You take certain medicines for conditions, including cancer, organ transplantation, and autoimmune conditions. Hepatitis C  Blood testing is recommended for:  Everyone born from [birth year] through 1964.  Anyone with known risk factors for hepatitis C. Sexually transmitted infections (STIs)  You should be screened for sexually transmitted infections (STIs) including gonorrhea and chlamydia if:  You are sexually active and are younger than 34 years of age.  You are older than 34 years of age and your health care provider tells you that you are at risk for this type of infection.  Your sexual activity has changed since you were last screened and you are at an increased risk for chlamydia or gonorrhea. Ask your health care provider if you are at risk.  If you do not have HIV, but are at risk, it may be recommended that you take a prescription medicine daily to prevent HIV infection. This is called pre-exposure prophylaxis (PrEP). You are considered at risk if:  You are sexually active and do not regularly use condoms or know the HIV status of your partner(s).  You take drugs by injection.  You are sexually  active with a partner who has HIV. Talk with your health care provider about whether you are at high risk of being infected with HIV. If you choose to begin PrEP, you should first be tested for HIV. You should then be tested every 3 months for as long as you are taking PrEP.  PREGNANCY   If you are premenopausal and you may become pregnant, ask your health care provider about preconception counseling.  If you may  become pregnant, take 400 to 800 micrograms (mcg) of folic acid every day.  If you want to prevent pregnancy, talk to your health care provider about birth control (contraception). OSTEOPOROSIS AND MENOPAUSE   Osteoporosis is a disease in which the bones lose minerals and strength with aging. This can result in serious bone fractures. Your risk for osteoporosis can be identified using a bone density scan.  If you are 61 years of age or older, or if you are at risk for osteoporosis and fractures, ask your health care provider if you should be screened.  Ask your health care provider whether you should take a calcium or vitamin D supplement to lower your risk for osteoporosis.  Menopause may have certain physical symptoms and risks.  Hormone replacement therapy may reduce some of these symptoms and risks. Talk to your health care provider about whether hormone replacement therapy is right for you.  HOME CARE INSTRUCTIONS   Schedule regular health, dental, and eye exams.  Stay current with your immunizations.   Do not use any tobacco products including cigarettes, chewing tobacco, or electronic cigarettes.  If you are pregnant, do not drink alcohol.  If you are breastfeeding, limit how much and how often you drink alcohol.  Limit alcohol intake to no more than 1 drink per day for nonpregnant women. One drink equals 12 ounces of beer, 5 ounces of wine, or 1 ounces of hard liquor.  Do not use street drugs.  Do not share needles.  Ask your health care provider for help if  you need support or information about quitting drugs.  Tell your health care provider if you often feel depressed.  Tell your health care provider if you have ever been abused or do not feel safe at home.   This information is not intended to replace advice given to you by your health care provider. Make sure you discuss any questions you have with your health care provider.   Document Released: 11/01/2010 Document Revised: 05/09/2014 Document Reviewed: 03/20/2013 Elsevier Interactive Patient Education [employer].

## 2015-07-09 LAB — URINALYSIS W MICROSCOPIC + REFLEX CULTURE
Bilirubin Urine: NEGATIVE
Casts: NONE SEEN [LPF]
Crystals: NONE SEEN [HPF]
GLUCOSE, UA: NEGATIVE
Hgb urine dipstick: NEGATIVE
KETONES UR: NEGATIVE
NITRITE: NEGATIVE
PH: 7 (ref 5.0–8.0)
Protein, ur: NEGATIVE
SPECIFIC GRAVITY, URINE: 1.003 (ref 1.001–1.035)
Yeast: NONE SEEN [HPF]

## 2015-07-10 LAB — URINE CULTURE
Colony Count: NO GROWTH
Organism ID, Bacteria: NO GROWTH

## 2015-10-13 IMAGING — CT CT ABD-PELV W/ CM
1 of 2 series · 15 of 32 positions shown, 19 images · IV contrast (omnipaque)
Comparison: None.

CLINICAL DATA: Right lower abdominal pain for the past 2 days

EXAM:
CT ABDOMEN AND PELVIS WITH CONTRAST
TECHNIQUE: Multidetector CT imaging of the abdomen and pelvis was performed
using the standard protocol following bolus administration of
intravenous contrast.
CONTRAST:  100mL OMNIPAQUE IOHEXOL 300 MG/ML  SOLN

[Series 2: abd/pel with · axial · 0.76mm/px · z∈[+793,+1223]mm · 15 of 94 slices shown, 19 images]
[im 4/94  soft-tissue]
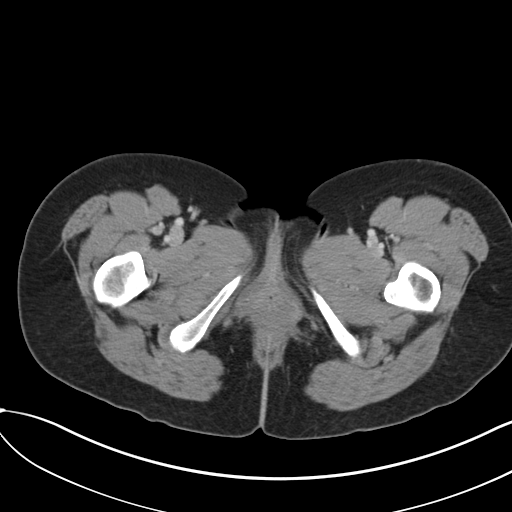
[im 4/94  bone]
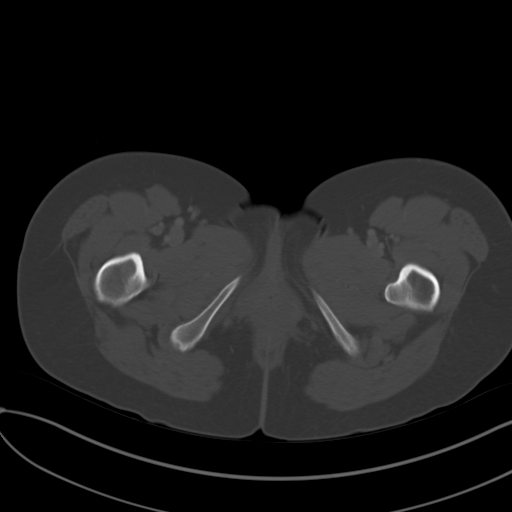
[im 12/94  soft-tissue]
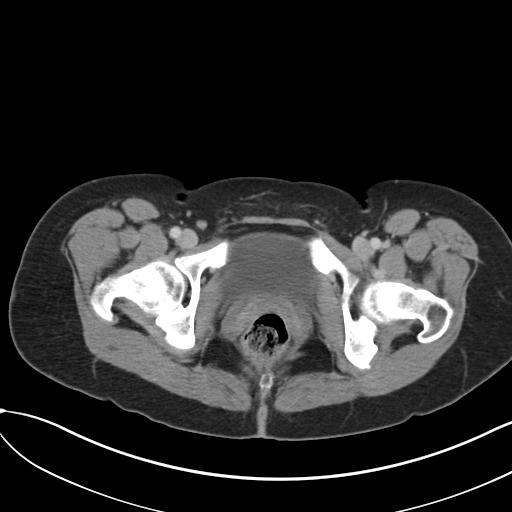
[im 19/94  soft-tissue]
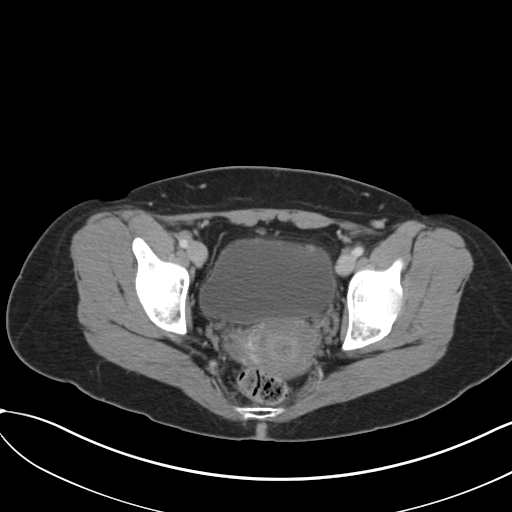
[im 27/94  soft-tissue]
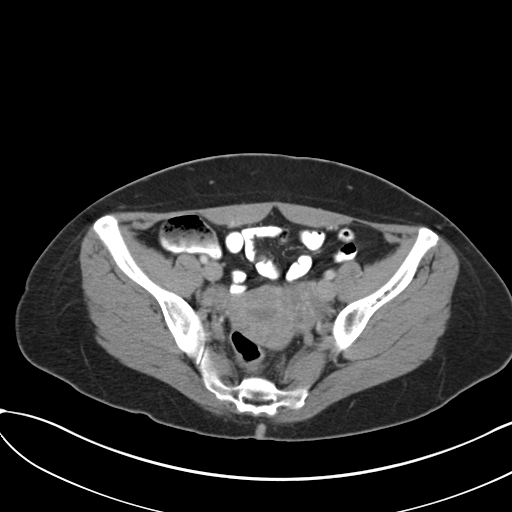
[im 34/94  soft-tissue]
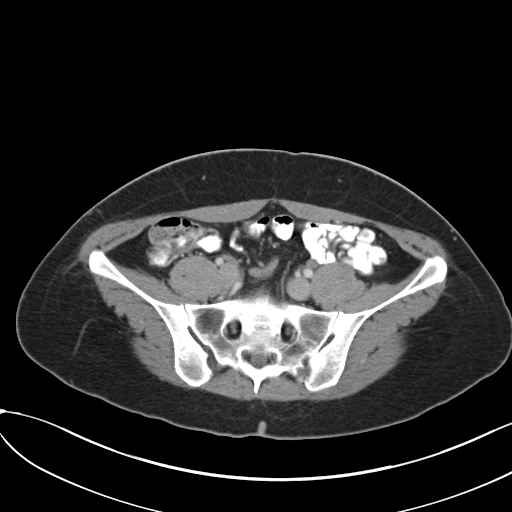
[im 41/94  soft-tissue]
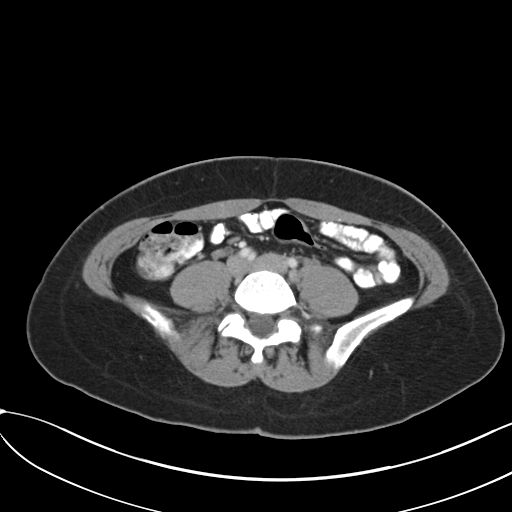
[im 49/94  soft-tissue]
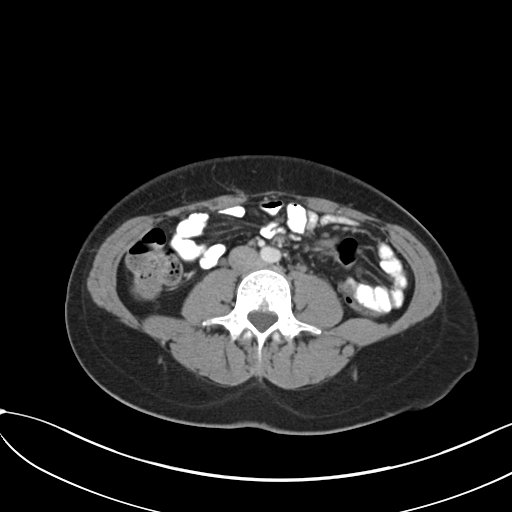
[im 53/94  soft-tissue]
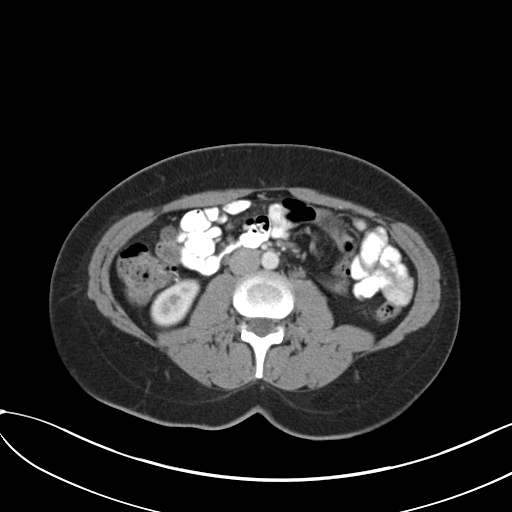
[im 60/94  soft-tissue]
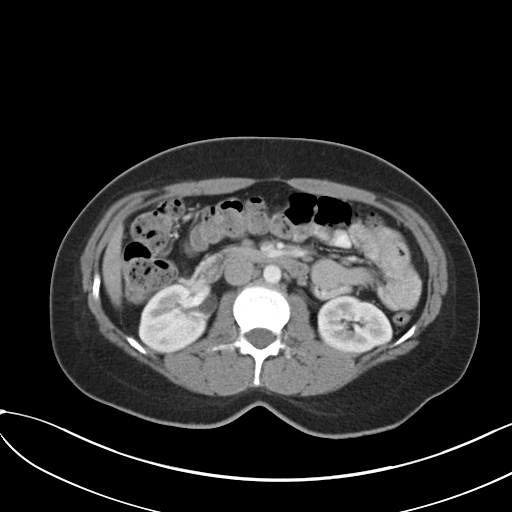
[im 60/94  bone]
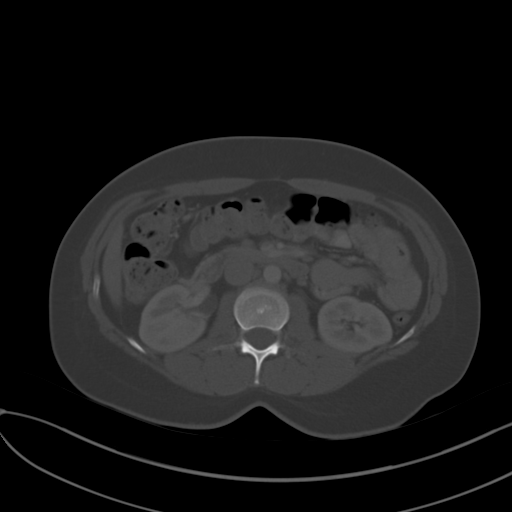
[im 67/94  soft-tissue]
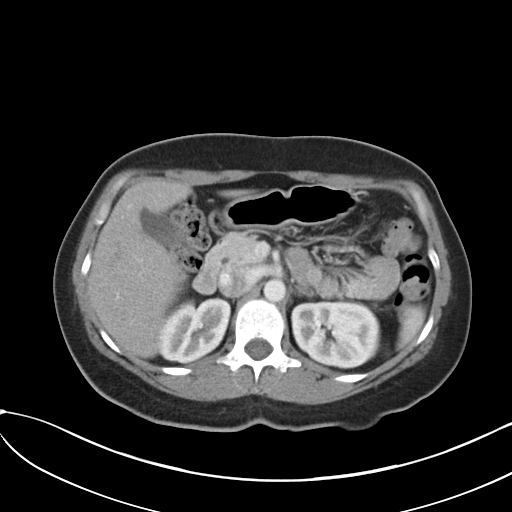
[im 75/94  soft-tissue]
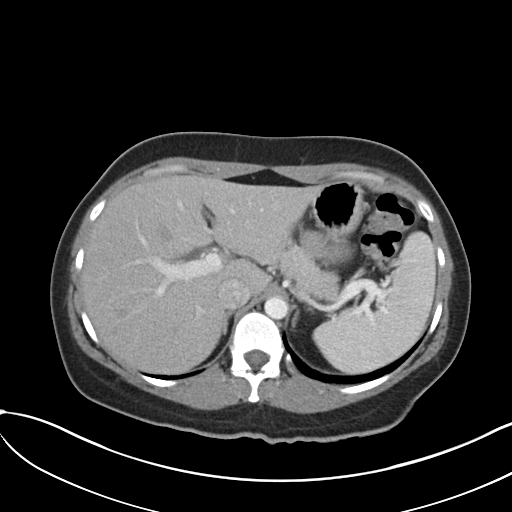
[im 79/94  lung]
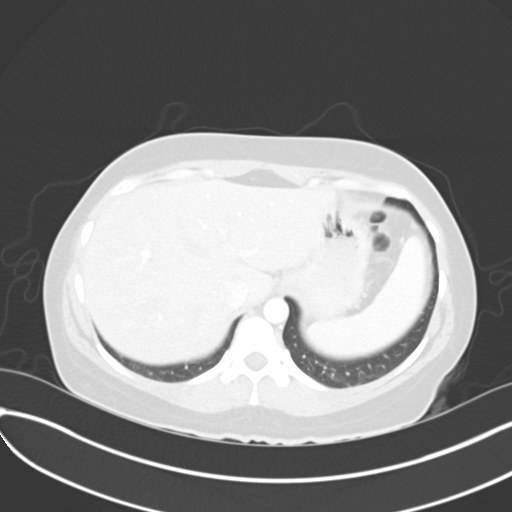
[im 82/94  soft-tissue]
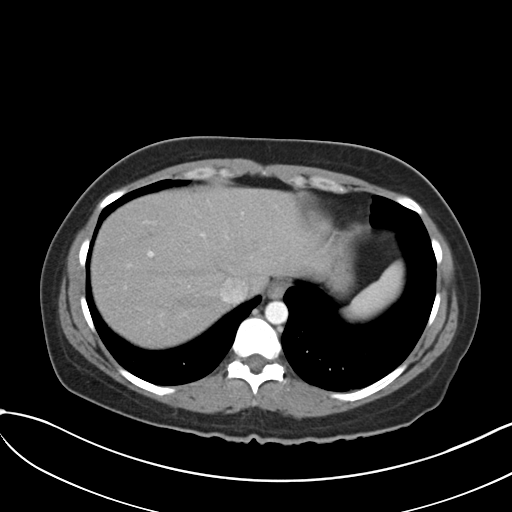
[im 82/94  lung]
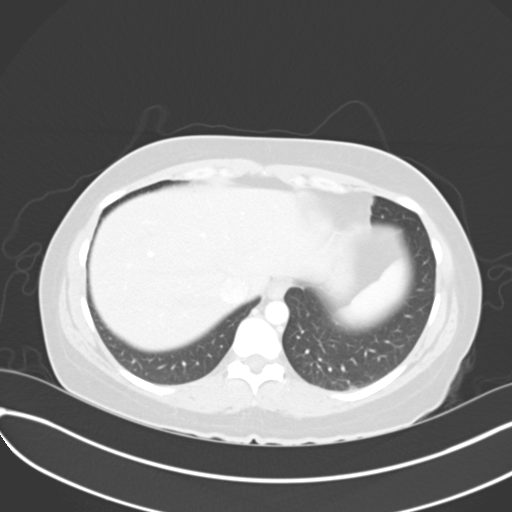
[im 86/94  lung]
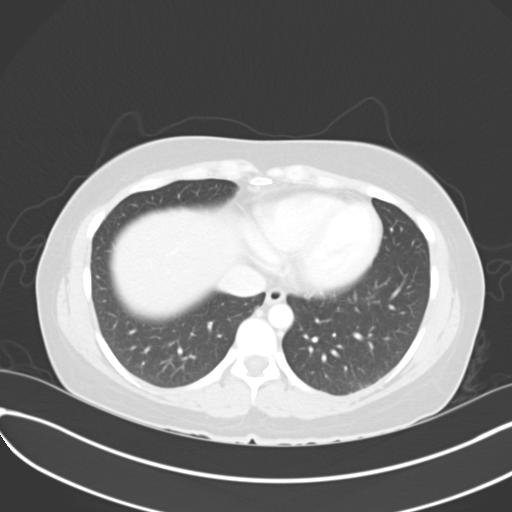
[im 90/94  soft-tissue]
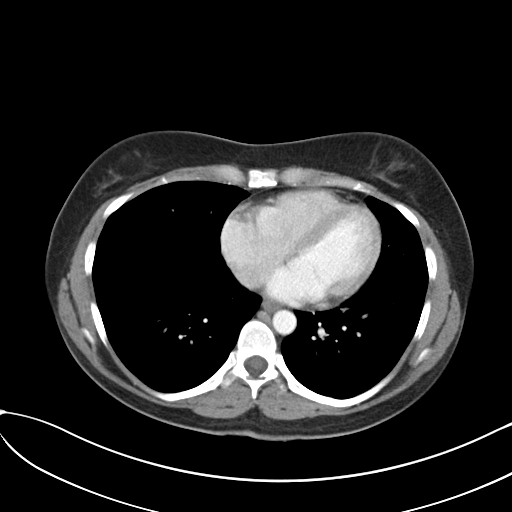
[im 90/94  lung]
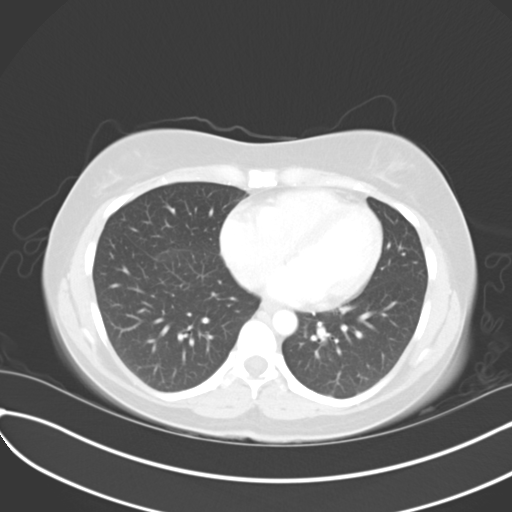

[15 of 32 positions shown; findings below may reference images not displayed]

FINDINGS: The lung bases are free of acute infiltrate or sizable effusion.

The liver, gallbladder, spleen, adrenal glands and pancreas are
within normal limits. The kidneys are within normal limits
bilaterally. No obstructive changes are seen. The appendix is well
visualized and within normal limits.

The bladder is well distended. The uterus and ovaries are well
visualized with multiple bilateral ovarian cysts. The largest of
these is noted on the left measuring 2 cm in dimension. No free
pelvic fluid is seen. No significant lymphadenopathy is identified.
No acute bony abnormality is noted.
IMPRESSION: Ovarian cystic change.

Normal-appearing appendix.

No acute abnormality is noted.

## 2016-05-03 NOTE — Progress Notes (Signed)
PATIENT: Lori Alvarado DOB: 1980/09/23  REASON FOR VISIT: follow up HISTORY FROM: patient  HISTORY OF PRESENT ILLNESS: Lori Alvarado is a 36 year old female with a history of ice pick headaches and seizures. She returns today for follow-up. She reports that she is currently on Keppra 500 mg twice a day. She denies any seizures. She is able to complete all ADLs independently. Denies any changes with her gait or balance. No change in her mood. She states that she is  currently not taking any precautions to prevent pregnancy. She is on folic acid. The patient states that she continues to have ice pick headache. Usually 1 event a day. The event only last for 30 seconds to 1 minute. She describes it as a sharp shooting pain. In the past she's been on Topamax with good benefit. She returns today for an evaluation.  HISTORY 05/05/15: Lori Alvarado is a 36 year old female with a history of ice pick headaches and seizures. She returns today for follow-up. She continues to take Keppra 750 mg twice a day. She denies any seizure events. She is able to complete all ADLs independently. She operates a Teacher, music without difficulty. The patient reports that she is no longer on Topamax. She states that her and her husband are considering pregnancy. At this time they're not taking any precautions to prevent pregnancy. The patient reports that her headaches are tolerable at this point. She denies any new symptoms. She returns today for an evaluation.  HISTORY 05/29/14: Lori Alvarado is a 36 year old female with a history of Ice pick headaches and seizures. She is currently taking Keppra 750 mg twice a day. She denies any recent seizure event. She operates a Teacher, music without difficulty. The patient is also taking Topamax 100 mg at bedtime for ice pick headaches. She reports that her headaches have improved. She states that she still will have them daily or every other day. She states that they are tolerable.  The ice pick pain only last for seconds and then resolve. Denies any additional symptoms with the ice pick pains. No new medical history since last seen.   HISTORY 09/19/13 (WILLIS): Lori Alvarado is a 36 year old right-handed white female with a history of migraine headaches and seizures. She has done well on Keppra taking 750 mg twice daily. She has very rare migraine headaches, but she has frequent ice pick pains that occur daily in nature. The patient is sleeping better at night as her child has gotten older. The patient has not noted any new medical issues that have come up since last seen. She is tolerating the Keppra well. In the past, she has noted that Topamax has helped ice pick pain frequency.  REVIEW OF SYSTEMS: Out of a complete 14 system review of symptoms, the patient complains only of the following symptoms, and all other reviewed systems are negative.  Blurred vision  ALLERGIES: Allergies  Allergen Reactions  . Lamictal [Lamotrigine] Other (See Comments)    Made her feel crazy    HOME MEDICATIONS: Outpatient Medications Prior to Visit  Medication Sig Dispense Refill  . folic acid (FOLVITE) 1 MG tablet Take 2 mg by mouth daily.    . valACYclovir (VALTREX) 500 MG tablet TAKE TWICE DAILY FOR 3 TO 5 DAYS 30 tablet 6   No facility-administered medications prior to visit.     PAST MEDICAL HISTORY: Past Medical History:  Diagnosis Date  . HSV-1 (herpes simplex virus 1) infection   . Migraine headache   .  Seizure disorder ([place])     PAST SURGICAL HISTORY: Past Surgical History:  Procedure Laterality Date  . NO PAST SURGERIES      FAMILY HISTORY: Family History  Problem Relation Age of Onset  . Hypertension Maternal Grandmother   . Heart disease Maternal Grandmother   . Cancer Cousin 21    DIED WITH LIVER/LUNG CANCER  . Hypertension Maternal Aunt   . Seizures Maternal Aunt     SOCIAL HISTORY: Social History   Social History  . Marital status: Married     Spouse name: N/A  . Number of children: 1  . Years of education: college   Occupational History  .  [employer]   Social History Main Topics  . Smoking status: Never Smoker  . Smokeless tobacco: Never Used  . Alcohol use 0.0 oz/week     Comment: occ  . Drug use: No  . Sexual activity: Yes    Partners: Male    Birth control/ protection: None     Comment: INTERCOURSE AGE 21, EXUAL PARTNERS LESS TRHAN 5   Other Topics Concern  . Not on file   Social History Narrative  . No narrative on file      PHYSICAL EXAM  Vitals:   05/04/16 0822  BP: 113/64  Pulse: 75  Weight: 158 lb (71.7 kg)  Height: 5\' 1"  (1.549 m)   Body mass index is 29.85 kg/m.  Generalized: Well developed, in no acute distress   Neurological examination  Mentation: Alert oriented to time, place, history taking. Follows all commands speech and language fluent Cranial nerve II-XII: Pupils were equal round reactive to light. Extraocular movements were full, visual field were full on confrontational test. Facial sensation and strength were normal. Uvula tongue midline. Head turning and shoulder shrug  were normal and symmetric. Motor: The motor testing reveals 5 over 5 strength of all 4 extremities. Good symmetric motor tone is noted throughout.  Sensory: Sensory testing is intact to soft touch on all 4 extremities. No evidence of extinction is noted.  Coordination: Cerebellar testing reveals good finger-nose-finger and heel-to-shin bilaterally.  Gait and station: Gait is normal. Tandem gait is normal. Romberg is negative. No drift is seen.  Reflexes: Deep tendon reflexes are symmetric and normal bilaterally.   DIAGNOSTIC DATA (LABS, IMAGING, TESTING) - I reviewed patient records, labs, notes, testing and imaging myself where available.     ASSESSMENT AND PLAN 36 y.o. year old female  has a past medical history of HSV-1 (herpes simplex virus 1) infection; Migraine headache; and Seizure disorder  ([place]). here with :  1. Seizure 2. Migraine headaches  The patient has not had any seizure events. She will continue on Keppra 500 mg twice a day. She should also continue on folic acid and she is trying to conceive. For now the patient is not on any medication for her headaches. She reports that her headaches are tolerable at this time. In the future if she is now trying to conceive or pregnant we may consider restarting Topamax. She will follow-up in one year or sooner if needed.     Ward Givens, MSN, NP-C 05/03/2016, 4:46 PM [facility] [address] [place], [place] [address] [phone]

## 2016-05-04 ENCOUNTER — Encounter: Payer: Self-pay | Admitting: Adult Health

## 2016-05-04 ENCOUNTER — Ambulatory Visit (INDEPENDENT_AMBULATORY_CARE_PROVIDER_SITE_OTHER): Payer: 59 | Admitting: Adult Health

## 2016-05-04 VITALS — BP 113/64 | HR 75 | Ht 61.0 in | Wt 158.0 lb

## 2016-05-04 DIAGNOSIS — R51 Headache: Secondary | ICD-10-CM | POA: Diagnosis not present

## 2016-05-04 DIAGNOSIS — R569 Unspecified convulsions: Secondary | ICD-10-CM

## 2016-05-04 DIAGNOSIS — R519 Headache, unspecified: Secondary | ICD-10-CM

## 2016-05-04 NOTE — Patient Instructions (Signed)
Continue Keppra If headaches worsen let us know If your symptoms worsen or you develop new symptoms please let us know.

## 2016-05-04 NOTE — Progress Notes (Signed)
I have read the note, and I agree with the clinical assessment and plan.  Dino Borntreger KEITH   

## 2016-06-06 ENCOUNTER — Other Ambulatory Visit: Payer: Self-pay | Admitting: Adult Health

## 2016-06-06 MED ORDER — FOLIC ACID 1 MG PO TABS: 1.0000 mg | ORAL_TABLET | Freq: Every day | ORAL | 3 refills | Status: DC

## 2016-06-06 MED ORDER — LEVETIRACETAM 750 MG PO TABS
750.0000 mg | ORAL_TABLET | Freq: Two times a day (BID) | ORAL | 3 refills | Status: DC
Start: 2016-06-06 — End: 2017-05-08

## 2016-06-06 NOTE — Telephone Encounter (Signed)
Pt said pharmacy insurance changed to [place] (p) [phone]. Pls send new RX for levETIRAcetam (KEPPRA) 750 MG tablet and folic acid (FOLVITE) 1 MG tablet 90 day supply

## 2016-06-06 NOTE — Telephone Encounter (Signed)
Spoke with pt and clarified that keppra is 750mg  po bid (been on that dose since after her baby was born 4 yrs ago) and also folic 1mg  po daily (she was not sure of dose).

## 2016-07-08 ENCOUNTER — Encounter: Payer: Self-pay | Admitting: Women's Health

## 2016-07-08 ENCOUNTER — Ambulatory Visit (INDEPENDENT_AMBULATORY_CARE_PROVIDER_SITE_OTHER): Payer: 59 | Admitting: Women's Health

## 2016-07-08 VITALS — BP 132/80 | Ht 61.5 in | Wt 156.0 lb

## 2016-07-08 DIAGNOSIS — Z01419 Encounter for gynecological examination (general) (routine) without abnormal findings: Secondary | ICD-10-CM

## 2016-07-08 DIAGNOSIS — B009 Herpesviral infection, unspecified: Secondary | ICD-10-CM | POA: Diagnosis not present

## 2016-07-08 DIAGNOSIS — Z13220 Encounter for screening for lipoid disorders: Secondary | ICD-10-CM

## 2016-07-08 LAB — URINALYSIS W MICROSCOPIC + REFLEX CULTURE
BILIRUBIN URINE: NEGATIVE
CASTS: NONE SEEN [LPF]
CRYSTALS: NONE SEEN [HPF]
Glucose, UA: NEGATIVE
HGB URINE DIPSTICK: NEGATIVE
KETONES UR: NEGATIVE
Leukocytes, UA: NEGATIVE
Nitrite: NEGATIVE
PROTEIN: NEGATIVE
SPECIFIC GRAVITY, URINE: 1.01 (ref 1.001–1.035)
Yeast: NONE SEEN [HPF]
pH: 7 (ref 5.0–8.0)

## 2016-07-08 MED ORDER — VALACYCLOVIR HCL 500 MG PO TABS: ORAL_TABLET | ORAL | 6 refills | Status: DC

## 2016-07-08 NOTE — Patient Instructions (Signed)
Health Maintenance, Female Adopting a healthy lifestyle and getting preventive care can go a long way to promote health and wellness. Talk with your health care provider about what schedule of regular examinations is right for you. This is a good chance for you to check in with your provider about disease prevention and staying healthy. In between checkups, there are plenty of things you can do on your own. Experts have done a lot of research about which lifestyle changes and preventive measures are most likely to keep you healthy. Ask your health care provider for more information. Weight and diet Eat a healthy diet  Be sure to include plenty of vegetables, fruits, low-fat dairy products, and lean protein.  Do not eat a lot of foods high in solid fats, added sugars, or salt.  Get regular exercise. This is one of the most important things you can do for your health.  Most adults should exercise for at least 150 minutes each week. The exercise should increase your heart rate and make you sweat (moderate-intensity exercise).  Most adults should also do strengthening exercises at least twice a week. This is in addition to the moderate-intensity exercise. Maintain a healthy weight  Body mass index (BMI) is a measurement that can be used to identify possible weight problems. It estimates body fat based on height and weight. Your health care provider can help determine your BMI and help you achieve or maintain a healthy weight.  For females 35 years of age and older:  A BMI below 18.5 is considered underweight.  A BMI of 18.5 to 24.9 is normal.  A BMI of 25 to 29.9 is considered overweight.  A BMI of 30 and above is considered obese. Watch levels of cholesterol and blood lipids  You should start having your blood tested for lipids and cholesterol at 35 years of age, then have this test every 5 years.  You may need to have your cholesterol levels checked more often if:  Your lipid or  cholesterol levels are high.  You are older than 35 years of age.  You are at high risk for heart disease. Cancer screening Lung Cancer  Lung cancer screening is recommended for adults 35-42 years old who are at high risk for lung cancer because of a history of smoking.  A yearly low-dose CT scan of the lungs is recommended for people who:  Currently smoke.  Have quit within the past 15 years.  Have at least a 30-pack-year history of smoking. A pack year is smoking an average of one pack of cigarettes a day for 1 year.  Yearly screening should continue until it has been 15 years since you quit.  Yearly screening should stop if you develop a health problem that would prevent you from having lung cancer treatment. Breast Cancer  Practice breast self-awareness. This means understanding how your breasts normally appear and feel.  It also means doing regular breast self-exams. Let your health care provider know about any changes, no matter how small.  If you are in your 35s, you should have a clinical breast exam (CBE) by a health care provider every 1-3 years as part of a regular health exam.  If you are 35 or older, have a CBE every year. Also consider having a breast X-ray (mammogram) every year.  If you have a family history of breast cancer, talk to your health care provider about genetic screening.  If you are at high risk for breast cancer, talk  to your health care provider about having an MRI and a mammogram every year.  Breast cancer gene (BRCA) assessment is recommended for women who have family members with BRCA-related cancers. BRCA-related cancers include:  Breast.  Ovarian.  Tubal.  Peritoneal cancers.  Results of the assessment will determine the need for genetic counseling and BRCA1 and BRCA2 testing. Cervical Cancer  Your health care provider may recommend that you be screened regularly for cancer of the pelvic organs (ovaries, uterus, and vagina).  This screening involves a pelvic examination, including checking for microscopic changes to the surface of your cervix (Pap test). You may be encouraged to have this screening done every 3 years, beginning at age 35.  For women ages 35-65, health care providers may recommend pelvic exams and Pap testing every 3 years, or they may recommend the Pap and pelvic exam, combined with testing for human papilloma virus (HPV), every 5 years. Some types of HPV increase your risk of cervical cancer. Testing for HPV may also be done on women of any age with unclear Pap test results.  Other health care providers may not recommend any screening for nonpregnant women who are considered low risk for pelvic cancer and who do not have symptoms. Ask your health care provider if a screening pelvic exam is right for you.  If you have had past treatment for cervical cancer or a condition that could lead to cancer, you need Pap tests and screening for cancer for at least 20 years after your treatment. If Pap tests have been discontinued, your risk factors (such as having a new sexual partner) need to be reassessed to determine if screening should resume. Some women have medical problems that increase the chance of getting cervical cancer. In these cases, your health care provider may recommend more frequent screening and Pap tests. Colorectal Cancer  This type of cancer can be detected and often prevented.  Routine colorectal cancer screening usually begins at 35 years of age and continues through 35 years of age.  Your health care provider may recommend screening at an earlier age if you have risk factors for colon cancer.  Your health care provider may also recommend using home test kits to check for hidden blood in the stool.  A small camera at the end of a tube can be used to examine your colon directly (sigmoidoscopy or colonoscopy). This is done to check for the earliest forms of colorectal cancer.  Routine  screening usually begins at age 35.  Direct examination of the colon should be repeated every 5-10 years through 35 years of age. However, you may need to be screened more often if early forms of precancerous polyps or small growths are found. Skin Cancer  Check your skin from head to toe regularly.  Tell your health care provider about any new moles or changes in moles, especially if there is a change in a mole's shape or color.  Also tell your health care provider if you have a mole that is larger than the size of a pencil eraser.  Always use sunscreen. Apply sunscreen liberally and repeatedly throughout the day.  Protect yourself by wearing long sleeves, pants, a wide-brimmed hat, and sunglasses whenever you are outside. Heart disease, diabetes, and high blood pressure  High blood pressure causes heart disease and increases the risk of stroke. High blood pressure is more likely to develop in:  People who have blood pressure in the high end of the normal range (130-139/85-89 mm Hg).  People who are overweight or obese.  People who are African American.  If you are 59-24 years of age, have your blood pressure checked every 3-5 years. If you are 34 years of age or older, have your blood pressure checked every year. You should have your blood pressure measured twice-once when you are at a hospital or clinic, and once when you are not at a hospital or clinic. Record the average of the two measurements. To check your blood pressure when you are not at a hospital or clinic, you can use:  An automated blood pressure machine at a pharmacy.  A home blood pressure monitor.  If you are between 29 years and 60 years old, ask your health care provider if you should take aspirin to prevent strokes.  Have regular diabetes screenings. This involves taking a blood sample to check your fasting blood sugar level.  If you are at a normal weight and have a low risk for diabetes, have this test once  every three years after 35 years of age.  If you are overweight and have a high risk for diabetes, consider being tested at a younger age or more often. Preventing infection Hepatitis B  If you have a higher risk for hepatitis B, you should be screened for this virus. You are considered at high risk for hepatitis B if:  You were born in a country where hepatitis B is common. Ask your health care provider which countries are considered high risk.  Your parents were born in a high-risk country, and you have not been immunized against hepatitis B (hepatitis B vaccine).  You have HIV or AIDS.  You use needles to inject street drugs.  You live with someone who has hepatitis B.  You have had sex with someone who has hepatitis B.  You get hemodialysis treatment.  You take certain medicines for conditions, including cancer, organ transplantation, and autoimmune conditions. Hepatitis C  Blood testing is recommended for:  Everyone born from [birth year] through 1964.  Anyone with known risk factors for hepatitis C. Sexually transmitted infections (STIs)  You should be screened for sexually transmitted infections (STIs) including gonorrhea and chlamydia if:  You are sexually active and are younger than 35 years of age.  You are older than 35 years of age and your health care provider tells you that you are at risk for this type of infection.  Your sexual activity has changed since you were last screened and you are at an increased risk for chlamydia or gonorrhea. Ask your health care provider if you are at risk.  If you do not have HIV, but are at risk, it may be recommended that you take a prescription medicine daily to prevent HIV infection. This is called pre-exposure prophylaxis (PrEP). You are considered at risk if:  You are sexually active and do not regularly use condoms or know the HIV status of your partner(s).  You take drugs by injection.  You are sexually active with a partner  who has HIV. Talk with your health care provider about whether you are at high risk of being infected with HIV. If you choose to begin PrEP, you should first be tested for HIV. You should then be tested every 3 months for as long as you are taking PrEP. Pregnancy  If you are premenopausal and you may become pregnant, ask your health care provider about preconception counseling.  If you may become pregnant, take 400 to 800 micrograms (mcg) of folic acid  every day.  If you want to prevent pregnancy, talk to your health care provider about birth control (contraception). Osteoporosis and menopause  Osteoporosis is a disease in which the bones lose minerals and strength with aging. This can result in serious bone fractures. Your risk for osteoporosis can be identified using a bone density scan.  If you are 4 years of age or older, or if you are at risk for osteoporosis and fractures, ask your health care provider if you should be screened.  Ask your health care provider whether you should take a calcium or vitamin D supplement to lower your risk for osteoporosis.  Menopause may have certain physical symptoms and risks.  Hormone replacement therapy may reduce some of these symptoms and risks. Talk to your health care provider about whether hormone replacement therapy is right for you. Follow these instructions at home:  Schedule regular health, dental, and eye exams.  Stay current with your immunizations.  Do not use any tobacco products including cigarettes, chewing tobacco, or electronic cigarettes.  If you are pregnant, do not drink alcohol.  If you are breastfeeding, limit how much and how often you drink alcohol.  Limit alcohol intake to no more than 1 drink per day for nonpregnant women. One drink equals 12 ounces of beer, 5 ounces of wine, or 1 ounces of hard liquor.  Do not use street drugs.  Do not share needles.  Ask your health care provider for help if you need support  or information about quitting drugs.  Tell your health care provider if you often feel depressed.  Tell your health care provider if you have ever been abused or do not feel safe at home. This information is not intended to replace advice given to you by your health care provider. Make sure you discuss any questions you have with your health care provider. Document Released: 11/01/2010 Document Revised: 09/24/2015 Document Reviewed: 01/20/2015 Elsevier Interactive Patient Education  2017 [employer].

## 2016-07-08 NOTE — Progress Notes (Signed)
Lori Alvarado 1980-11-16 [id number]    History:    Presents for annual exam.  Regular monthly cycle using no contraception, rhythm, pregnancy okay. Had been on Topamax aware that it was category D no longer on daily, uses it prn. Is on Kepra, history of seizures category C, seizure-free 5 years. Normal Pap history Gardasil series completed. HSV 1 outbreaks. Health screening at work reports all normal levels.  Past medical history, past surgical history, family history and social history were all reviewed and documented in the EPIC chart. Works for [employer], son and 3-1/2 doing well.  ROS:  A ROS was performed and pertinent positives and negatives are included.  Exam:  Vitals:   07/08/16 0805  BP: 132/80  Weight: 156 lb (70.8 kg)  Height: 5' 1.5" (1.562 m)   Body mass index is 29 kg/m.   General appearance:  Normal Thyroid:  Symmetrical, normal in size, without palpable masses or nodularity. Respiratory  Auscultation:  Clear without wheezing or rhonchi Cardiovascular  Auscultation:  Regular rate, without rubs, murmurs or gallops  Edema/varicosities:  Not grossly evident Abdominal  Soft,nontender, without masses, guarding or rebound.  Liver/spleen:  No organomegaly noted  Hernia:  None appreciated  Skin  Inspection:  Grossly normal   Breasts: Examined lying and sitting.     Right: Without masses, retractions, discharge or axillary adenopathy.     Left: Without masses, retractions, discharge or axillary adenopathy. Gentitourinary   Inguinal/mons:  Normal without inguinal adenopathy  External genitalia:  Normal  BUS/Urethra/Skene's glands:  Normal  Vagina:  Normal  Cervix:  Normal  Uterus:   normal in size, shape and contour.  Midline and mobile  Adnexa/parametria:     Rt: Without masses or tenderness.   Lt: Without masses or tenderness.  Anus and perineum: Normal  Digital rectal exam: Normal sphincter tone without palpated masses or tenderness  Assessment/Plan:  36 y.o.  MWF G1 P1 for annual exam no complaints.  Monthly cycle/rhythm Seizure history none greater than 5 years Keppra-neurologist  Plan: Declines contraception, states is going to decide in the next year if desire second pregnancy, aware we no longer deliver will return to office with missed cycle for viability ultrasound. SBE's, exercise, calcium rich diet, MVI daily encouraged. Reviewed Kepra category C we will continue, avoid Topamax.(D)  Valtrex 500 twice daily for 3-5 days as needed prescription given. Where has gained 10 pounds in the past year will decrease carbs and diet. Has had increased travel with work. CBC, UA, Pap normal 2016 with negative HR HPV, new screening guidelines reviewed.  Huel Cote [facility], 8:33 AM 07/08/2016

## 2016-07-10 LAB — URINE CULTURE: Organism ID, Bacteria: NO GROWTH

## 2016-07-11 ENCOUNTER — Other Ambulatory Visit: Payer: Self-pay | Admitting: Adult Health

## 2016-07-14 ENCOUNTER — Other Ambulatory Visit: Payer: Self-pay | Admitting: Women's Health

## 2016-07-14 DIAGNOSIS — R21 Rash and other nonspecific skin eruption: Secondary | ICD-10-CM

## 2016-07-14 MED ORDER — PREDNISONE 5 MG PO TABS: ORAL_TABLET | ORAL | 0 refills | Status: DC

## 2016-07-14 NOTE — Progress Notes (Signed)
TC has a macular rash covering entire body mostly concentrated on her trunk started Augmentin yesterday. Denies any difficulty bleeding, swallowing, will start on a prednisone Dosepak and call if no relief. Denies a fever. Augmentin was prescribed from an urgent care due to upper respiratory symptoms.

## 2016-09-14 ENCOUNTER — Encounter: Payer: Self-pay | Admitting: Gynecology

## 2016-09-19 ENCOUNTER — Telehealth: Payer: Self-pay | Admitting: Adult Health

## 2016-09-19 MED ORDER — TOPIRAMATE 25 MG PO TABS: 25.0000 mg | ORAL_TABLET | Freq: Every day | ORAL | 3 refills | Status: DC

## 2016-09-19 NOTE — Telephone Encounter (Signed)
I called the patient. She states that her ice pick headaches are occurring on a daily basis. She is unsure if the location is the same every time. She denies any additional symptoms when she gets this pain. She states that sharp shooting pain can occur for approximately 10 minutes. She does not have any lacrimation or nasal congestion during this time. She states in the past she was on Topamax for these headaches and got good benefit. She reports that she is not currently trying to get pregnant. She states that she is not on any birth control at this time but is willing to do that in order to take Topamax. I explained that Topamax is a pregnancy category D. Topamax may cause fetal harm if administered to a pregnant woman. An increased risk of oral clefts and for being small for gestational has has been observed following in utero exposure. Patient voices understanding. She states that she will use birth control in the form of condoms while on Topamax. She assured me that she will discontinue the use of Topamax if she decides she would like to become pregnant. I will prescribe Topamax 25 mg at bedtime.

## 2016-09-19 NOTE — Telephone Encounter (Signed)
Pt is wanting to start topamax again. Said the HA's are getting bad again. Pharmacy: [facility]

## 2016-09-19 NOTE — Telephone Encounter (Signed)
I spoke to pt and she relayed that she is not trying to get pregnant at this time or in the near future.  She has worsening head pains, would like to get started back on topamax (25mg  po am and 100mg  po pm).  Wanted to get 30 day at local [facility] then 90 day at mail order after that [place].

## 2016-09-19 NOTE — Telephone Encounter (Signed)
Okay for use of Topamax as long as she is aware of risks, and is taking folic acid.

## 2016-09-21 NOTE — Telephone Encounter (Signed)
Please call patient and make sure she is still daily folic acid daily

## 2016-09-21 NOTE — Telephone Encounter (Signed)
I LMVM for pt that she should be on daily folic acid.  She is to call us back and let us know she received this message if not I will try her again.

## 2016-09-22 NOTE — Telephone Encounter (Signed)
LMVM for pt again on her cell #, relating message to take daily folic acid.  Relayed to call back and confirm message received.

## 2016-09-22 NOTE — Telephone Encounter (Signed)
Spoke to pt and she is taking folic 1mg  po daily.

## 2016-10-11 MED ORDER — TOPIRAMATE 25 MG PO TABS: 50.0000 mg | ORAL_TABLET | Freq: Every day | ORAL | 11 refills | Status: DC

## 2016-10-11 NOTE — Telephone Encounter (Signed)
Pt is taking topamax 25mg  qd but it is not helping. She is wanting to increase it. Please call

## 2016-10-11 NOTE — Addendum Note (Signed)
Addended by: Trudie Buckler on: 10/11/2016 10:09 AM   Modules accepted: Orders

## 2016-10-11 NOTE — Telephone Encounter (Signed)
I called pt and advised her that Jinny Blossom, NP has increased her topamax to 50mg  qhs and sent this RX to [facility]. Pt verbalized understanding. Pt had no questions at this time but was encouraged to call back if questions arise.

## 2016-10-11 NOTE — Telephone Encounter (Signed)
Increase Topamax 50 mg at bedtime. Patient is no longer trying to conceive. I have discussed pregnancy risk with the patient since she started Topamax. She voiced understanding at that time. Please let patient know I have increased Topamax to 50 mg at bedtime.

## 2016-11-17 ENCOUNTER — Telehealth: Payer: Self-pay | Admitting: Adult Health

## 2016-11-17 MED ORDER — TOPIRAMATE 25 MG PO TABS: 75.0000 mg | ORAL_TABLET | Freq: Every day | ORAL | 11 refills | Status: DC

## 2016-11-17 NOTE — Telephone Encounter (Addendum)
I increased the patient's Topamax to 75 mg. patient states that she still has frequent headaches. She states increasing 50 mg daily helped but has not resolved her headaches. She states in the past she was on 125 mg of Topamax. We will increase to 75 mg for now. The patient will need a follow-up appointment in the next 2-3 months.

## 2016-11-17 NOTE — Addendum Note (Signed)
Addended by: Trudie Buckler on: 11/17/2016 04:26 PM   Modules accepted: Orders

## 2016-11-17 NOTE — Telephone Encounter (Signed)
Patient called office in reference to Topamax 50mg  per patient she states it is not working and would like to know if it can be increased to 75mg .  Pharmacy- [facility]

## 2016-11-22 NOTE — Telephone Encounter (Signed)
LMVM for pt on her mobile.  Made appt for 02/15/17 at 0800, if this not good to call us back.  (this is an earlier appt 2-3 mo per MM/NP request.

## 2016-11-23 ENCOUNTER — Encounter: Payer: Self-pay | Admitting: *Deleted

## 2016-11-23 NOTE — Telephone Encounter (Signed)
Mailed letter relating to appointment scheduled.

## 2017-02-15 ENCOUNTER — Ambulatory Visit: Payer: Self-pay | Admitting: Adult Health

## 2017-05-08 ENCOUNTER — Ambulatory Visit (INDEPENDENT_AMBULATORY_CARE_PROVIDER_SITE_OTHER): Payer: 59 | Admitting: Adult Health

## 2017-05-08 ENCOUNTER — Encounter: Payer: Self-pay | Admitting: Adult Health

## 2017-05-08 VITALS — BP 118/80 | HR 63 | Wt 153.0 lb

## 2017-05-08 DIAGNOSIS — G4489 Other headache syndrome: Secondary | ICD-10-CM | POA: Diagnosis not present

## 2017-05-08 DIAGNOSIS — R569 Unspecified convulsions: Secondary | ICD-10-CM | POA: Diagnosis not present

## 2017-05-08 MED ORDER — LEVETIRACETAM 750 MG PO TABS: 750.0000 mg | ORAL_TABLET | Freq: Two times a day (BID) | ORAL | 3 refills | Status: DC

## 2017-05-08 NOTE — Patient Instructions (Signed)
Your Plan:  Continue Keppra If you have any seizure events please let us know If your headaches worsen let us know If your symptoms worsen or you develop new symptoms please let us know.      Thank you for coming to see [place] at [facility]. I hope we have been able to provide you high quality care today.  You may receive a patient satisfaction survey over the next few weeks. We would appreciate your feedback and comments so that we may continue to improve ourselves and the health of our patients.

## 2017-05-08 NOTE — Progress Notes (Signed)
I have read the note, and I agree with the clinical assessment and plan.  Yunus Stoklosa K Ryli Standlee   

## 2017-05-08 NOTE — Progress Notes (Signed)
PATIENT: Lori Alvarado DOB: 1981-04-11  REASON FOR VISIT: follow up- headaches, seizures HISTORY FROM: patient  HISTORY OF PRESENT ILLNESS: Today 05/08/17  Lori Alvarado is a 37 year old female with a history of  headaches and seizures.  She returns today for follow-up.  She reports that she weaned herself off of Topamax.  She states that it was offering her limited benefit.  She states that she has been exercising and finds that that helps her headaches the most.  She states that she has at least one episode a week where she has sharp shooting pain in the head that resolves really quickly.  She denies any associated symptoms.  She remains on Keppra for seizure prevention.  She operates a Teacher, music without difficulty.  She is able to complete all ADLs independently.  She reports that her husband are still trying to conceive .she returns today for evaluation.  HISTORY Lori Alvarado is a 37 year old female with a history of ice pick headaches and seizures. She returns today for follow-up. She reports that she is currently on Keppra 500 mg twice a day. She denies any seizures. She is able to complete all ADLs independently. Denies any changes with her gait or balance. No change in her mood. She states that she is  currently not taking any precautions to prevent pregnancy. She is on folic acid. The patient states that she continues to have ice pick headache. Usually 1 event a day. The event only last for 30 seconds to 1 minute. She describes it as a sharp shooting pain. In the past she's been on Topamax with good benefit. She returns today for an evaluation.   REVIEW OF SYSTEMS: Out of a complete 14 system review of symptoms, the patient complains only of the following symptoms, and all other reviewed systems are negative.  See HPI  ALLERGIES: Allergies  Allergen Reactions  . Augmentin [Amoxicillin-Pot Clavulanate] Rash  . Lamictal [Lamotrigine] Other (See Comments)    Made her feel  crazy    HOME MEDICATIONS: Outpatient Medications Prior to Visit  Medication Sig Dispense Refill  . folic acid (FOLVITE) 1 MG tablet Take 1 tablet (1 mg total) by mouth daily. (Patient not taking: Reported on 07/08/2016) 90 tablet 3  . levETIRAcetam (KEPPRA) 750 MG tablet Take 1 tablet (750 mg total) by mouth 2 (two) times daily. 180 tablet 3  . predniSONE (DELTASONE) 5 MG tablet Take 5 tablets today, 4 tomorrow,  3 next day,  2 and then 1 daily until gone 16 tablet 0  . topiramate (TOPAMAX) 25 MG tablet Take 3 tablets (75 mg total) by mouth at bedtime. 90 tablet 11  . valACYclovir (VALTREX) 500 MG tablet TAKE TWICE DAILY FOR 3 TO 5 DAYS 30 tablet 6   No facility-administered medications prior to visit.     PAST MEDICAL HISTORY: Past Medical History:  Diagnosis Date  . HSV-1 (herpes simplex virus 1) infection   . Migraine headache   . Seizure disorder ([place])     PAST SURGICAL HISTORY: Past Surgical History:  Procedure Laterality Date  . NO PAST SURGERIES      FAMILY HISTORY: Family History  Problem Relation Age of Onset  . Hypertension Maternal Grandmother   . Heart disease Maternal Grandmother   . Cancer Cousin 21       DIED WITH LIVER/LUNG CANCER  . Hypertension Maternal Aunt   . Seizures Maternal Aunt     SOCIAL HISTORY: Social History   Socioeconomic History  .  Marital status: Married    Spouse name: Not on file  . Number of children: 1  . Years of education: college  . Highest education level: Not on file  Social Needs  . Financial resource strain: Not on file  . Food insecurity - worry: Not on file  . Food insecurity - inability: Not on file  . Transportation needs - medical: Not on file  . Transportation needs - non-medical: Not on file  Occupational History    Employer: [employer]  Tobacco Use  . Smoking status: Never Smoker  . Smokeless tobacco: Never Used  Substance and Sexual Activity  . Alcohol use: Yes    Alcohol/week: 0.0 oz    Comment:  occ  . Drug use: No  . Sexual activity: Yes    Partners: Male    Birth control/protection: None    Comment: INTERCOURSE AGE 11, EXUAL PARTNERS LESS TRHAN 5  Other Topics Concern  . Not on file  Social History Narrative  . Not on file      PHYSICAL EXAM  Vitals:   05/08/17 0809  BP: 118/80  Pulse: 63  Weight: 153 lb (69.4 kg)   Body mass index is 28.44 kg/m.  Generalized: Well developed, in no acute distress   Neurological examination  Mentation: Alert oriented to time, place, history taking. Follows all commands speech and language fluent Cranial nerve II-XII: Pupils were equal round reactive to light. Extraocular movements were full, visual field were full on confrontational test. Facial sensation and strength were normal. Uvula tongue midline. Head turning and shoulder shrug  were normal and symmetric. Motor: The motor testing reveals 5 over 5 strength of all 4 extremities. Good symmetric motor tone is noted throughout.  Sensory: Sensory testing is intact to soft touch on all 4 extremities. No evidence of extinction is noted.  Coordination: Cerebellar testing reveals good finger-nose-finger and heel-to-shin bilaterally.  Gait and station: Gait is normal. Tandem gait is normal. Romberg is negative. No drift is seen.  Reflexes: Deep tendon reflexes are symmetric and normal bilaterally.   DIAGNOSTIC DATA (LABS, IMAGING, TESTING) - I reviewed patient records, labs, notes, testing and imaging myself where available.  Lab Results  Component Value Date   WBC 5.5 02/25/2014   HGB 14.6 02/25/2014   HCT 43.6 02/25/2014   MCV 85.5 02/25/2014   PLT 216 02/25/2014      Component Value Date/Time   NA 139 02/25/2014 1545   K 4.2 02/25/2014 1545   CL 105 02/25/2014 1545   CO2 20 02/25/2014 1545   GLUCOSE 86 02/25/2014 1545   BUN 8 02/25/2014 1545   CREATININE 0.66 02/25/2014 1545   CALCIUM 9.0 02/25/2014 1545   PROT 7.8 02/25/2014 1545   ALBUMIN 4.3 02/25/2014 1545   AST  24 02/25/2014 1545   ALT 9 02/25/2014 1545   ALKPHOS 73 02/25/2014 1545   BILITOT 0.5 02/25/2014 1545   GFRNONAA >90 02/25/2014 1545   GFRAA >90 02/25/2014 1545   Lab Results  Component Value Date   CHOL 139 06/29/2012   HDL 56 06/29/2012   LDLCALC 73 06/29/2012   TRIG 50 06/29/2012   CHOLHDL 2.5 06/29/2012      ASSESSMENT AND PLAN 37 y.o. year old female  has a past medical history of HSV-1 (herpes simplex virus 1) infection, Migraine headache, and Seizure disorder ([place]). here with :  1.  Seizures 2.  Headache  Overall the patient is doing well.  She will continue on Keppra 750 mg  twice a day.  She is advised that if she has any seizure events she should let us know.  She will continue on folic acid 1 mg daily.  She is advised that if her headache frequency increases she should let us know.  She will follow-up in 1 year or sooner if needed.     Ward Givens, MSN, NP-C 05/08/2017, 8:05 AM [facility] [address] [place], [place] [address] [phone]

## 2017-05-09 ENCOUNTER — Telehealth: Payer: Self-pay | Admitting: *Deleted

## 2017-05-09 MED ORDER — LEVETIRACETAM 750 MG PO TABS: 750.0000 mg | ORAL_TABLET | Freq: Two times a day (BID) | ORAL | 3 refills | Status: DC

## 2017-05-09 NOTE — Telephone Encounter (Signed)
Received fax from [facility] stating patient is requesting them to refill Levetiracetam Rx there. Medication was refilled yesterday; changed to [facility].

## 2017-08-03 ENCOUNTER — Telehealth: Payer: Self-pay | Admitting: Adult Health

## 2017-08-03 MED ORDER — FOLIC ACID 1 MG PO TABS: 1.0000 mg | ORAL_TABLET | Freq: Every day | ORAL | 3 refills | Status: DC

## 2017-08-03 NOTE — Telephone Encounter (Signed)
Pt request refill for folic acid (FOLVITE) 1 MG tablet sent to [facility]

## 2017-08-03 NOTE — Telephone Encounter (Signed)
Folic acid refill escribed to [facility] as patient requested.

## 2017-08-08 ENCOUNTER — Encounter: Payer: Self-pay | Admitting: Women's Health

## 2017-08-08 ENCOUNTER — Ambulatory Visit (INDEPENDENT_AMBULATORY_CARE_PROVIDER_SITE_OTHER): Payer: 59 | Admitting: Women's Health

## 2017-08-08 VITALS — BP 118/78 | Ht 61.0 in | Wt 149.0 lb

## 2017-08-08 DIAGNOSIS — B009 Herpesviral infection, unspecified: Secondary | ICD-10-CM

## 2017-08-08 DIAGNOSIS — Z01419 Encounter for gynecological examination (general) (routine) without abnormal findings: Secondary | ICD-10-CM

## 2017-08-08 MED ORDER — VALACYCLOVIR HCL 500 MG PO TABS: ORAL_TABLET | ORAL | 6 refills | Status: DC

## 2017-08-08 NOTE — Addendum Note (Signed)
Addended by: Janalyn Harder A on: 08/08/2017 09:30 AM   Modules accepted: Orders

## 2017-08-08 NOTE — Progress Notes (Signed)
Lori Alvarado First 1980/09/06 [id number]    History:    Presents for annual exam.  Regular monthly cycle using no contraception pregnancy okay.  Has lost 10 pounds in the past year with diet and exercise.  Seizure-free greater than 7 years on Keppra.  HSV 1 rare outbreaks.  Normal Pap history.  Past medical history, past surgical history, family history and social history were all reviewed and documented in the EPIC chart.  Mother hypertension.  Son is 4-1/2 doing well.  Works at [employer].  ROS:  A ROS was performed and pertinent positives and negatives are included.  Exam:  Vitals:   08/08/17 0827  BP: 118/78  Weight: 149 lb (67.6 kg)  Height: 5\' 1"  (1.549 m)   Body mass index is 28.15 kg/m.   General appearance:  Normal Thyroid:  Symmetrical, normal in size, without palpable masses or nodularity. Respiratory  Auscultation:  Clear without wheezing or rhonchi Cardiovascular  Auscultation:  Regular rate, without rubs, murmurs or gallops  Edema/varicosities:  Not grossly evident Abdominal  Soft,nontender, without masses, guarding or rebound.  Liver/spleen:  No organomegaly noted  Hernia:  None appreciated  Skin  Inspection:  Grossly normal   Breasts: Examined lying and sitting.     Right: Without masses, retractions, discharge or axillary adenopathy.     Left: Without masses, retractions, discharge or axillary adenopathy. Gentitourinary   Inguinal/mons:  Normal without inguinal adenopathy  External genitalia:  Normal  BUS/Urethra/Skene's glands:  Normal  Vagina:  Normal  Cervix:  Normal  Uterus:  normal in size, shape and contour.  Midline and mobile  Adnexa/parametria:     Rt: Without masses or tenderness.   Lt: Without masses or tenderness.  Anus and perineum: Normal  Digital rectal exam: Normal sphincter tone without palpated masses or tenderness  Assessment/Plan:  37 y.o. MWF G1 P1 for annual exam no complaints.  Monthly cycle/no contraception/pregnancy okay History  of seizures/seizure for greater than 7 years on Keppra HSV 1 rare outbreaks  Plan: Continue folic acid daily, say pregnancy behaviors reviewed, will return to office with Ms. cycle.  Reviewed importance of increasing frequency.  Valtrex 500 twice daily for 3-5 days as needed, prescription, proper use given.  SBEs, annual screening mammogram at 41.  Continue healthy lifestyle with regular exercise and healthy diet.  Reports normal labs at health screening at work cholesterol, glucose and CBC.  Pap with HR HPV typing, the screening guidelines reviewed.Huel Cote [facility], 9:15 AM 08/08/2017

## 2017-08-08 NOTE — Patient Instructions (Signed)

## 2017-08-10 LAB — PAP, TP IMAGING W/ HPV RNA, RFLX HPV TYPE 16,18/45: HPV DNA HIGH RISK: NOT DETECTED

## 2017-09-01 LAB — CULTURE INDICATED

## 2017-09-01 LAB — URINALYSIS, COMPLETE W/RFL CULTURE
Bilirubin Urine: NEGATIVE
Glucose, UA: NEGATIVE
HGB URINE DIPSTICK: NEGATIVE
HYALINE CAST: NONE SEEN /LPF
Ketones, ur: NEGATIVE
Leukocyte Esterase: NEGATIVE
Nitrites, Initial: NEGATIVE
PROTEIN: NEGATIVE
RBC / HPF: NONE SEEN /HPF (ref 0–2)
Specific Gravity, Urine: 1.015 (ref 1.001–1.03)
pH: 7 (ref 5.0–8.0)

## 2017-09-01 LAB — URINE CULTURE
MICRO NUMBER:: 90435659
SPECIMEN QUALITY:: ADEQUATE

## 2018-03-21 ENCOUNTER — Other Ambulatory Visit: Payer: Self-pay | Admitting: Podiatry

## 2018-03-21 ENCOUNTER — Encounter: Payer: Self-pay | Admitting: Podiatry

## 2018-03-21 ENCOUNTER — Ambulatory Visit (INDEPENDENT_AMBULATORY_CARE_PROVIDER_SITE_OTHER): Payer: 59

## 2018-03-21 ENCOUNTER — Ambulatory Visit (INDEPENDENT_AMBULATORY_CARE_PROVIDER_SITE_OTHER): Payer: 59 | Admitting: Podiatry

## 2018-03-21 VITALS — BP 133/86

## 2018-03-21 DIAGNOSIS — M79671 Pain in right foot: Secondary | ICD-10-CM | POA: Diagnosis not present

## 2018-03-21 DIAGNOSIS — M79672 Pain in left foot: Secondary | ICD-10-CM | POA: Diagnosis not present

## 2018-03-21 DIAGNOSIS — M2012 Hallux valgus (acquired), left foot: Secondary | ICD-10-CM | POA: Diagnosis not present

## 2018-03-21 DIAGNOSIS — M21619 Bunion of unspecified foot: Secondary | ICD-10-CM

## 2018-03-21 DIAGNOSIS — M2011 Hallux valgus (acquired), right foot: Secondary | ICD-10-CM

## 2018-03-21 NOTE — Patient Instructions (Signed)

## 2018-03-22 NOTE — Progress Notes (Signed)
Subjective:   Patient ID: Lori Alvarado, female   DOB: 37 y.o.   MRN: [id number]   HPI Patient presents stating that she is had a painful bunion deformity both feet with the right being slightly worse.  Patient states that she has family history of this and her mom has had it and other people and it gradually is becoming more difficult for her to wear shoe gear with and she is known she is needed correction and she is very active and does not smoke   Review of Systems  All other systems reviewed and are negative.       Objective:  Physical Exam  Constitutional: She appears well-developed and well-nourished.  Cardiovascular: Intact distal pulses.  Pulmonary/Chest: Effort normal.  Musculoskeletal: Normal range of motion.  Neurological: She is alert.  Skin: Skin is warm.  Nursing note and vitals reviewed.   Neurovascular status intact muscle strength is adequate range of motion within normal limits with patient found to have hyperostosis medial aspect first metatarsal head bilateral with redness around the first metatarsal and pain with palpation.  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  Spent a great deal time going over deformity and considerations conservative treatment and she is tried wider shoes and padding without relief and the pain is becoming worse and I recommended structural distal osteotomy correction and patient wants to get this done understanding procedure and risk.  Patient will have this done hopefully in the early winter and is encouraged to call with questions and we will see her prior to surgery to greater detail  X-ray indicates that there is moderate elevation of the first metatarsal angle with prominence around the first metatarsal head  noted bilateral

## 2018-05-02 NOTE — L&D Delivery Note (Signed)
Operative Delivery Note At 5:26 PM a viable and healthy female was delivered via Vaginal, Vacuum Neurosurgeon).  Presentation: vertex; Position: Left,, Occiput,, Anterior; Station: +2.  Verbal consent: obtained from patient.  Risks and benefits discussed in detail.  Risks include, but are not limited to the risks of anesthesia, bleeding, infection, damage to maternal tissues, fetal cephalhematoma.  There is also the risk of inability to effect vaginal delivery of the head, or shoulder dystocia that cannot be resolved by established maneuvers, leading to the need for emergency cesarean section. Indication: fetal heart deceleration APGAR: 9, 9; weight pending .   Placenta status:spontaneous intact not sent , .   Cord:  with the following complications: .  Cord pH: none  Anesthesia:  epidural Instruments: mushroom Episiotomy: Median Lacerations: None Suture Repair: 3.0 chromic Est. Blood Loss (mL):  121  Mom to postpartum.  Baby to Couplet care / Skin to Skin.  Kagen Kunath A Eboni Coval 02/09/2019, 6:00 PM

## 2018-05-09 ENCOUNTER — Ambulatory Visit: Payer: BLUE CROSS/BLUE SHIELD | Admitting: Adult Health

## 2018-05-09 ENCOUNTER — Encounter: Payer: Self-pay | Admitting: Adult Health

## 2018-05-09 VITALS — BP 115/72 | HR 69 | Ht 61.0 in | Wt 154.8 lb

## 2018-05-09 DIAGNOSIS — G4489 Other headache syndrome: Secondary | ICD-10-CM | POA: Diagnosis not present

## 2018-05-09 DIAGNOSIS — R569 Unspecified convulsions: Secondary | ICD-10-CM | POA: Diagnosis not present

## 2018-05-09 MED ORDER — LEVETIRACETAM 750 MG PO TABS: 750.0000 mg | ORAL_TABLET | Freq: Two times a day (BID) | ORAL | 3 refills | Status: DC

## 2018-05-09 MED ORDER — FOLIC ACID 1 MG PO TABS: 1.0000 mg | ORAL_TABLET | Freq: Every day | ORAL | 3 refills | Status: DC

## 2018-05-09 NOTE — Progress Notes (Signed)
PATIENT: Lori Alvarado DOB: 07/06/1980  REASON FOR VISIT: follow up HISTORY FROM: patient  HISTORY OF PRESENT ILLNESS: Today 05/09/18:  Lori Alvarado is a 38 year old female with a history of seizures and headaches.  She returns today for follow-up.  She remains on Keppra and is tolerating it well.  She denies any seizure events.  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  She reports that her headaches are manageable.  She states that she continues to have that sharp shooting pain in the head that lasts for several seconds at least every other day.  But she does not wish to be on any medication at this time.   She returns today for evaluation.  HISTORY 05/08/17  Lori Alvarado is a 38 year old female with a history of  headaches and seizures.  She returns today for follow-up.  She reports that she weaned herself off of Topamax.  She states that it was offering her limited benefit.  She states that she has been exercising and finds that that helps her headaches the most.  She states that she has at least one episode a week where she has sharp shooting pain in the head that resolves really quickly.  She denies any associated symptoms.  She remains on Keppra for seizure prevention.  She operates a Teacher, music without difficulty.  She is able to complete all ADLs independently.  She reports that her husband are still trying to conceive .she returns today for evaluation.  REVIEW OF SYSTEMS: Out of a complete 14 system review of symptoms, the patient complains only of the following symptoms, and all other reviewed systems are negative.  See HPI  ALLERGIES: Allergies  Allergen Reactions  . Augmentin [Amoxicillin-Pot Clavulanate] Rash  . Lamictal [Lamotrigine] Other (See Comments)    Made her feel crazy  . Other Other (See Comments)    HOME MEDICATIONS: Outpatient Medications Prior to Visit  Medication Sig Dispense Refill  . folic acid (FOLVITE) 1  MG tablet Take 1 tablet (1 mg total) by mouth daily. 90 tablet 3  . levETIRAcetam (KEPPRA) 750 MG tablet Take 1 tablet (750 mg total) by mouth 2 (two) times daily. 180 tablet 3  . valACYclovir (VALTREX) 500 MG tablet TAKE TWICE DAILY FOR 3 TO 5 DAYS 30 tablet 6   No facility-administered medications prior to visit.     PAST MEDICAL HISTORY: Past Medical History:  Diagnosis Date  . HSV-1 (herpes simplex virus 1) infection   . Migraine headache   . Seizure disorder ([place])     PAST SURGICAL HISTORY: Past Surgical History:  Procedure Laterality Date  . NO PAST SURGERIES      FAMILY HISTORY: Family History  Problem Relation Age of Onset  . Hypertension Maternal Grandmother   . Heart disease Maternal Grandmother   . Cancer Cousin 21       DIED WITH LIVER/LUNG CANCER  . Hypertension Maternal Aunt   . Seizures Maternal Aunt     SOCIAL HISTORY: Social History   Socioeconomic History  . Marital status: Married    Spouse name: Not on file  . Number of children: 1  . Years of education: college  . Highest education level: Not on file  Occupational History    Employer: [employer]  . Financial resource strain: Not on file  . Food insecurity:    Worry: Not on file    Inability: Not on file  . Transportation  needs:    Medical: Not on file    Non-medical: Not on file  Tobacco Use  . Smoking status: Never Smoker  . Smokeless tobacco: Never Used  Substance and Sexual Activity  . Alcohol use: Yes    Alcohol/week: 0.0 standard drinks    Comment: occ  . Drug use: No  . Sexual activity: Yes    Partners: Male    Birth control/protection: None    Comment: INTERCOURSE AGE 43, EXUAL PARTNERS LESS TRHAN 5  Lifestyle  . Physical activity:    Days per week: Not on file    Minutes per session: Not on file  . Stress: Not on file  Relationships  . Social connections:    Talks on phone: Not on file    Gets together: Not on file    Attends religious service:  Not on file    Active member of club or organization: Not on file    Attends meetings of clubs or organizations: Not on file    Relationship status: Not on file  . Intimate partner violence:    Fear of current or ex partner: Not on file    Emotionally abused: Not on file    Physically abused: Not on file    Forced sexual activity: Not on file  Other Topics Concern  . Not on file  Social History Narrative  . Not on file      PHYSICAL EXAM  Vitals:   05/09/18 0822  BP: 115/72  Pulse: 69  Weight: 154 lb 12.8 oz (70.2 kg)  Height: 5\' 1"  (1.549 m)   Body mass index is 29.25 kg/m.  Generalized: Well developed, in no acute distress   Neurological examination  Mentation: Alert oriented to time, place, history taking. Follows all commands speech and language fluent Cranial nerve II-XII: Pupils were equal round reactive to light. Extraocular movements were full, visual field were full on confrontational test. Facial sensation and strength were normal. Uvula tongue midline. Head turning and shoulder shrug  were normal and symmetric. Motor: The motor testing reveals 5 over 5 strength of all 4 extremities. Good symmetric motor tone is noted throughout.  Sensory: Sensory testing is intact to soft touch on all 4 extremities. No evidence of extinction is noted.  Coordination: Cerebellar testing reveals good finger-nose-finger and heel-to-shin bilaterally.  Gait and station: Gait is normal. Tandem gait is normal. Romberg is negative. No drift is seen.  Reflexes: Deep tendon reflexes are symmetric and normal bilaterally.   DIAGNOSTIC DATA (LABS, IMAGING, TESTING) - I reviewed patient records, labs, notes, testing and imaging myself where available.  Lab Results  Component Value Date   WBC 5.5 02/25/2014   HGB 14.6 02/25/2014   HCT 43.6 02/25/2014   MCV 85.5 02/25/2014   PLT 216 02/25/2014      Component Value Date/Time   NA 139 02/25/2014 1545   K 4.2 02/25/2014 1545   CL 105  02/25/2014 1545   CO2 20 02/25/2014 1545   GLUCOSE 86 02/25/2014 1545   BUN 8 02/25/2014 1545   CREATININE 0.66 02/25/2014 1545   CALCIUM 9.0 02/25/2014 1545   PROT 7.8 02/25/2014 1545   ALBUMIN 4.3 02/25/2014 1545   AST 24 02/25/2014 1545   ALT 9 02/25/2014 1545   ALKPHOS 73 02/25/2014 1545   BILITOT 0.5 02/25/2014 1545   GFRNONAA >90 02/25/2014 1545   GFRAA >90 02/25/2014 1545   Lab Results  Component Value Date   CHOL 139 06/29/2012   HDL 56  06/29/2012   LDLCALC 73 06/29/2012   TRIG 50 06/29/2012   CHOLHDL 2.5 06/29/2012     ASSESSMENT AND PLAN 38 y.o. year old female  has a past medical history of HSV-1 (herpes simplex virus 1) infection, Migraine headache, and Seizure disorder ([place]). here with :  1.  Seizures 2.  headaches  Overall the patient is doing well.  She will continue on Keppra for seizures.  She is advised that if she has any seizure events she should let us know.  She will continue to monitor her headaches.  If her migraine/headache frequency increases she should let us know.  She will follow-up in 1 year or sooner if needed.   Ward Givens, MSN, NP-C 05/09/2018, 8:29 AM [facility] [address], [place], [place] [address] [phone]

## 2018-05-09 NOTE — Patient Instructions (Signed)
Your Plan:  Continue Keppra Monitor Headache frequency If you have any seizure events please let us know.   Thank you for coming to see [place] at [facility]. I hope we have been able to provide you high quality care today.  You may receive a patient satisfaction survey over the next few weeks. We would appreciate your feedback and comments so that we may continue to improve ourselves and the health of our patients.

## 2018-05-09 NOTE — Progress Notes (Signed)
I have read the note, and I agree with the clinical assessment and plan.  Shayona Hibbitts K Latonya Knight   

## 2018-06-11 DIAGNOSIS — Z3201 Encounter for pregnancy test, result positive: Secondary | ICD-10-CM | POA: Diagnosis not present

## 2018-06-14 ENCOUNTER — Telehealth: Payer: Self-pay | Admitting: Adult Health

## 2018-06-14 NOTE — Telephone Encounter (Signed)
I called the patient.  The patient currently is pregnant, her due date is in October.  She is to remain on Keppra, she is on folic acid.  The dose of the Keppra may need to be increased in the third trimester, would not alter the dose currently.

## 2018-06-14 NOTE — Telephone Encounter (Signed)
Pt called to inform us that she is now expecting and is wanting to know if anything needs to be changed medication wise. Please advise.

## 2018-07-02 DIAGNOSIS — Z6829 Body mass index (BMI) 29.0-29.9, adult: Secondary | ICD-10-CM | POA: Diagnosis not present

## 2018-07-02 DIAGNOSIS — Z3201 Encounter for pregnancy test, result positive: Secondary | ICD-10-CM | POA: Diagnosis not present

## 2018-07-09 DIAGNOSIS — Z3689 Encounter for other specified antenatal screening: Secondary | ICD-10-CM | POA: Diagnosis not present

## 2018-07-09 DIAGNOSIS — O09521 Supervision of elderly multigravida, first trimester: Secondary | ICD-10-CM | POA: Diagnosis not present

## 2018-07-09 LAB — OB RESULTS CONSOLE HIV ANTIBODY (ROUTINE TESTING): HIV: NONREACTIVE

## 2018-07-09 LAB — OB RESULTS CONSOLE RUBELLA ANTIBODY, IGM: Rubella: IMMUNE

## 2018-07-09 LAB — OB RESULTS CONSOLE RPR: RPR: NONREACTIVE

## 2018-07-09 LAB — OB RESULTS CONSOLE HEPATITIS B SURFACE ANTIGEN: Hepatitis B Surface Ag: NEGATIVE

## 2018-07-09 LAB — OB RESULTS CONSOLE GBS: GBS: POSITIVE

## 2018-07-20 DIAGNOSIS — Z3689 Encounter for other specified antenatal screening: Secondary | ICD-10-CM | POA: Diagnosis not present

## 2018-07-20 DIAGNOSIS — Z113 Encounter for screening for infections with a predominantly sexual mode of transmission: Secondary | ICD-10-CM | POA: Diagnosis not present

## 2018-07-20 DIAGNOSIS — Z3A10 10 weeks gestation of pregnancy: Secondary | ICD-10-CM | POA: Diagnosis not present

## 2018-07-20 DIAGNOSIS — Z124 Encounter for screening for malignant neoplasm of cervix: Secondary | ICD-10-CM | POA: Diagnosis not present

## 2018-07-20 DIAGNOSIS — O99820 Streptococcus B carrier state complicating pregnancy: Secondary | ICD-10-CM | POA: Diagnosis not present

## 2018-07-20 DIAGNOSIS — O09521 Supervision of elderly multigravida, first trimester: Secondary | ICD-10-CM | POA: Diagnosis not present

## 2018-07-20 DIAGNOSIS — Z118 Encounter for screening for other infectious and parasitic diseases: Secondary | ICD-10-CM | POA: Diagnosis not present

## 2018-07-20 LAB — OB RESULTS CONSOLE GC/CHLAMYDIA
Chlamydia: NEGATIVE
Gonorrhea: NEGATIVE

## 2018-08-14 ENCOUNTER — Encounter: Payer: 59 | Admitting: Women's Health

## 2018-08-20 DIAGNOSIS — Z3A15 15 weeks gestation of pregnancy: Secondary | ICD-10-CM | POA: Diagnosis not present

## 2018-08-20 DIAGNOSIS — O09522 Supervision of elderly multigravida, second trimester: Secondary | ICD-10-CM | POA: Diagnosis not present

## 2018-08-20 DIAGNOSIS — Z361 Encounter for antenatal screening for raised alphafetoprotein level: Secondary | ICD-10-CM | POA: Diagnosis not present

## 2018-09-17 DIAGNOSIS — Z3A19 19 weeks gestation of pregnancy: Secondary | ICD-10-CM | POA: Diagnosis not present

## 2018-09-17 DIAGNOSIS — O09522 Supervision of elderly multigravida, second trimester: Secondary | ICD-10-CM | POA: Diagnosis not present

## 2018-10-15 DIAGNOSIS — O09522 Supervision of elderly multigravida, second trimester: Secondary | ICD-10-CM | POA: Diagnosis not present

## 2018-10-15 DIAGNOSIS — Z3A23 23 weeks gestation of pregnancy: Secondary | ICD-10-CM | POA: Diagnosis not present

## 2018-11-14 DIAGNOSIS — O09522 Supervision of elderly multigravida, second trimester: Secondary | ICD-10-CM | POA: Diagnosis not present

## 2018-11-14 DIAGNOSIS — Z3689 Encounter for other specified antenatal screening: Secondary | ICD-10-CM | POA: Diagnosis not present

## 2018-11-14 DIAGNOSIS — Z3A27 27 weeks gestation of pregnancy: Secondary | ICD-10-CM | POA: Diagnosis not present

## 2018-11-28 DIAGNOSIS — Z3A29 29 weeks gestation of pregnancy: Secondary | ICD-10-CM | POA: Diagnosis not present

## 2018-11-28 DIAGNOSIS — O99353 Diseases of the nervous system complicating pregnancy, third trimester: Secondary | ICD-10-CM | POA: Diagnosis not present

## 2018-11-28 DIAGNOSIS — Z3689 Encounter for other specified antenatal screening: Secondary | ICD-10-CM | POA: Diagnosis not present

## 2018-11-28 DIAGNOSIS — Z23 Encounter for immunization: Secondary | ICD-10-CM | POA: Diagnosis not present

## 2018-11-28 DIAGNOSIS — O09523 Supervision of elderly multigravida, third trimester: Secondary | ICD-10-CM | POA: Diagnosis not present

## 2018-12-10 DIAGNOSIS — O99820 Streptococcus B carrier state complicating pregnancy: Secondary | ICD-10-CM | POA: Diagnosis not present

## 2018-12-10 DIAGNOSIS — O09523 Supervision of elderly multigravida, third trimester: Secondary | ICD-10-CM | POA: Diagnosis not present

## 2018-12-10 DIAGNOSIS — Z3A31 31 weeks gestation of pregnancy: Secondary | ICD-10-CM | POA: Diagnosis not present

## 2018-12-10 DIAGNOSIS — O99350 Diseases of the nervous system complicating pregnancy, unspecified trimester: Secondary | ICD-10-CM | POA: Diagnosis not present

## 2018-12-25 ENCOUNTER — Other Ambulatory Visit: Payer: Self-pay

## 2018-12-25 ENCOUNTER — Encounter (HOSPITAL_COMMUNITY): Payer: Self-pay | Admitting: *Deleted

## 2018-12-25 ENCOUNTER — Inpatient Hospital Stay (HOSPITAL_COMMUNITY)
Admission: AD | Admit: 2018-12-25 | Discharge: 2018-12-25 | Disposition: A | Discharge status: Home / Self Care | Payer: BC Managed Care – PPO | Attending: Obstetrics and Gynecology | Admitting: Obstetrics and Gynecology

## 2018-12-25 DIAGNOSIS — O09523 Supervision of elderly multigravida, third trimester: Secondary | ICD-10-CM | POA: Diagnosis not present

## 2018-12-25 DIAGNOSIS — O99350 Diseases of the nervous system complicating pregnancy, unspecified trimester: Secondary | ICD-10-CM | POA: Diagnosis not present

## 2018-12-25 DIAGNOSIS — Z880 Allergy status to penicillin: Secondary | ICD-10-CM | POA: Insufficient documentation

## 2018-12-25 DIAGNOSIS — O99820 Streptococcus B carrier state complicating pregnancy: Secondary | ICD-10-CM | POA: Diagnosis not present

## 2018-12-25 DIAGNOSIS — O4290 Premature rupture of membranes, unspecified as to length of time between rupture and onset of labor, unspecified weeks of gestation: Secondary | ICD-10-CM

## 2018-12-25 DIAGNOSIS — Z881 Allergy status to other antibiotic agents status: Secondary | ICD-10-CM | POA: Diagnosis not present

## 2018-12-25 DIAGNOSIS — Z0371 Encounter for suspected problem with amniotic cavity and membrane ruled out: Secondary | ICD-10-CM | POA: Insufficient documentation

## 2018-12-25 DIAGNOSIS — Z6833 Body mass index (BMI) 33.0-33.9, adult: Secondary | ICD-10-CM | POA: Diagnosis not present

## 2018-12-25 LAB — AMNISURE RUPTURE OF MEMBRANE (ROM) NOT AT ARMC: Amnisure ROM: NEGATIVE

## 2018-12-25 LAB — URINALYSIS, ROUTINE W REFLEX MICROSCOPIC
Bilirubin Urine: NEGATIVE
Glucose, UA: NEGATIVE mg/dL
Hgb urine dipstick: NEGATIVE
Ketones, ur: NEGATIVE mg/dL
Nitrite: NEGATIVE
Protein, ur: NEGATIVE mg/dL
Specific Gravity, Urine: 1.005 (ref 1.005–1.030)
pH: 8 (ref 5.0–8.0)

## 2018-12-25 NOTE — MAU Note (Signed)
May ask ?'s and get med hx with mother in rm

## 2018-12-25 NOTE — MAU Provider Note (Signed)
History     Chief Complaint  Patient presents with  . Rupture of Membranes    38 yo  G2P1001  MF @ 33 2/[redacted] wk gestation sent from the office for amniosure. Pt c/o intermittent leaking of fluid since yesterday evening. Pt does not think it is bladder fluid. She was seen in the office and had fern neg but given her hx sent here  for additional testing. (+) FM  OB History    Gravida  2   Para  1   Term  1   Preterm      AB      Living  1     SAB      TAB      Ectopic      Multiple      Live Births  1           Past Medical History:  Diagnosis Date  . HSV-1 (herpes simplex virus 1) infection   . Migraine headache   . Seizure disorder [facility])     Past Surgical History:  Procedure Laterality Date  . NO PAST SURGERIES      Family History  Problem Relation Age of Onset  . Hypertension Maternal Grandmother   . Heart disease Maternal Grandmother   . Cancer Cousin 21       DIED WITH LIVER/LUNG CANCER  . Hypertension Maternal Aunt   . Seizures Maternal Aunt     Social History   Tobacco Use  . Smoking status: Never Smoker  . Smokeless tobacco: Never Used  Substance Use Topics  . Alcohol use: Yes    Alcohol/week: 0.0 standard drinks    Comment: occ  . Drug use: No    Allergies:  Allergies  Allergen Reactions  . Augmentin [Amoxicillin-Pot Clavulanate] Rash  . Lamictal [Lamotrigine] Other (See Comments)    Made her feel crazy  . Other Other (See Comments)    Medications Prior to Admission  Medication Sig Dispense Refill Last Dose  . folic acid (FOLVITE) 1 MG tablet Take 1 tablet (1 mg total) by mouth daily. 90 tablet 3 12/25/2018 at Unknown time  . levETIRAcetam (KEPPRA) 750 MG tablet Take 1 tablet (750 mg total) by mouth 2 (two) times daily. 180 tablet 3 12/25/2018 at Unknown time     Physical Exam   Blood pressure 109/69, pulse 85, temperature 98.9 F (37.2 C), temperature source Oral, resp. rate 17, weight 87 kg, SpO2 98 %.  No exam  performed today,  done in office.  Tracing: baseline 130 (+) accels to 155-160  no ctx ED Course  Leaking fluid IUP @ 33 2/7 weeks Seizure disorder P) amniosure MDM  amniosure neg. D/c home  keep sched OB appt friday  Marvene Staff, MD 12:41 PM 12/25/2018

## 2018-12-25 NOTE — Discharge Instructions (Signed)
Premature Rupture and Preterm Premature Rupture of Membranes  A sac made up of membranes surrounds your baby in the womb (uterus). Rupture of membranes is when this sac breaks open. This is also known as your "water breaking." When this sac breaks before labor starts, it is called premature rupture of membranes (PROM). If this happens before 37 weeks of being pregnant, it is called preterm premature rupture of membranes (PPROM). PPROM is serious. It needs medical care right away. What increases the risk of PPROM? PPROM is more likely to happen in women who:  Have an infection.  Have had PPROM before.  Have a cervix that is short.  Have bleeding during the second or third trimester.  Have a low BMI. This is a measure of body fat.  Smoke.  Use drugs.  Have a low socioeconomic status. What problems can be caused by PROM and PPROM? This condition creates health dangers for the mother and the baby. These include:  Giving birth to the baby too early (prematurely).  Getting a serious infection of the placenta (chorioamnionitis).  Having the placenta detach from the uterus early (placental abruption).  Squeezing of the umbilical cord.  Getting a serious infection after delivery. What are the signs of PROM and PPROM?  A sudden gush of fluid from the vagina.  A slow leak of fluid from the vagina.  Your underwear is wet. What should I do if I think my water broke? Call your doctor right away. You will need to go to the hospital to get checked right away. What happens if I am told that I have PROM or PPROM? You will have tests done at the hospital.  If you have PROM, you may be given medicine to start labor (be induced). This may be done if you are not having contractions during the 24 hours after your water broke.  If you have PPROM and are not having contractions, you may be given medicine to start labor. It will depend on how far along you are in your pregnancy. If you have  PPROM:  You and your baby will be watched closely to see if you have infections or other problems.  You may be given: ? An antibiotic medicine. This can stop an infection from starting. ? A steroid medicine. This can help your baby's lungs develop faster. ? A medicine to help prevent cerebral palsy in your baby. ? A medicine to stop early labor (preterm labor).  You may be told to stay in bed except to use the bathroom (bed rest).  You may be given medicine to start labor. This may be done if there are problems with you or the baby. Your treatment will depend on many factors. Contact a doctor if:  Your water breaks and you are not having contractions. Get help right away if:  Your water breaks before you are [redacted] weeks pregnant. Summary  When your water breaks before labor starts, it is called premature rupture of membranes (PROM).  When PROM happens before 37 weeks of pregnancy, it is called preterm premature rupture of membranes (PPROM).  If you are not having contractions, your labor may be started for you. This information is not intended to replace advice given to you by your health care provider. Make sure you discuss any questions you have with your health care provider. Document Released: 07/15/2008 Document Revised: 08/10/2018 Document Reviewed: 01/07/2016 Elsevier Patient Education  2020 [employer].

## 2018-12-25 NOTE — MAU Note (Signed)
Pt reports clear fluid leaking since last night. Still leaking today. Denies VB, vaginal discharge. FM   Had r/u Maryann Alar at office today and was negative. Dr Garwin Brothers ordered Amnisure.

## 2018-12-25 NOTE — MAU Note (Signed)
Is leaking, started last night. Underwear was wet.   Has been more this morning.  Had gush when she sat down to use toilet once, felt like more of a gush then urine. No pain, some pressure, no bleeding.

## 2018-12-28 DIAGNOSIS — Z3A33 33 weeks gestation of pregnancy: Secondary | ICD-10-CM | POA: Diagnosis not present

## 2018-12-28 DIAGNOSIS — O3663X0 Maternal care for excessive fetal growth, third trimester, not applicable or unspecified: Secondary | ICD-10-CM | POA: Diagnosis not present

## 2018-12-28 DIAGNOSIS — O99353 Diseases of the nervous system complicating pregnancy, third trimester: Secondary | ICD-10-CM | POA: Diagnosis not present

## 2019-01-11 DIAGNOSIS — Z3A35 35 weeks gestation of pregnancy: Secondary | ICD-10-CM | POA: Diagnosis not present

## 2019-01-11 DIAGNOSIS — O99353 Diseases of the nervous system complicating pregnancy, third trimester: Secondary | ICD-10-CM | POA: Diagnosis not present

## 2019-01-16 DIAGNOSIS — O99353 Diseases of the nervous system complicating pregnancy, third trimester: Secondary | ICD-10-CM | POA: Diagnosis not present

## 2019-01-16 DIAGNOSIS — Z3A36 36 weeks gestation of pregnancy: Secondary | ICD-10-CM | POA: Diagnosis not present

## 2019-01-24 DIAGNOSIS — O99353 Diseases of the nervous system complicating pregnancy, third trimester: Secondary | ICD-10-CM | POA: Diagnosis not present

## 2019-01-24 DIAGNOSIS — Z3A37 37 weeks gestation of pregnancy: Secondary | ICD-10-CM | POA: Diagnosis not present

## 2019-01-24 DIAGNOSIS — Z23 Encounter for immunization: Secondary | ICD-10-CM | POA: Diagnosis not present

## 2019-01-30 DIAGNOSIS — O99353 Diseases of the nervous system complicating pregnancy, third trimester: Secondary | ICD-10-CM | POA: Diagnosis not present

## 2019-01-30 DIAGNOSIS — Z3A38 38 weeks gestation of pregnancy: Secondary | ICD-10-CM | POA: Diagnosis not present

## 2019-02-06 ENCOUNTER — Other Ambulatory Visit: Payer: Self-pay | Admitting: Obstetrics and Gynecology

## 2019-02-06 ENCOUNTER — Telehealth (HOSPITAL_COMMUNITY): Payer: Self-pay | Admitting: *Deleted

## 2019-02-06 ENCOUNTER — Encounter (HOSPITAL_COMMUNITY): Payer: Self-pay | Admitting: *Deleted

## 2019-02-06 DIAGNOSIS — Z3A39 39 weeks gestation of pregnancy: Secondary | ICD-10-CM | POA: Diagnosis not present

## 2019-02-06 DIAGNOSIS — O99353 Diseases of the nervous system complicating pregnancy, third trimester: Secondary | ICD-10-CM | POA: Diagnosis not present

## 2019-02-06 NOTE — Telephone Encounter (Signed)
Preadmission screen  

## 2019-02-07 ENCOUNTER — Other Ambulatory Visit: Payer: Self-pay | Admitting: Obstetrics and Gynecology

## 2019-02-07 ENCOUNTER — Other Ambulatory Visit: Payer: Self-pay

## 2019-02-07 ENCOUNTER — Ambulatory Visit (HOSPITAL_COMMUNITY)
Admission: RE | Admit: 2019-02-07 | Discharge: 2019-02-07 | Disposition: A | Discharge status: Home / Self Care | Payer: BC Managed Care – PPO | Source: Ambulatory Visit | Attending: Obstetrics and Gynecology | Admitting: Obstetrics and Gynecology

## 2019-02-07 DIAGNOSIS — Z01812 Encounter for preprocedural laboratory examination: Secondary | ICD-10-CM | POA: Diagnosis not present

## 2019-02-07 DIAGNOSIS — Z20828 Contact with and (suspected) exposure to other viral communicable diseases: Secondary | ICD-10-CM | POA: Insufficient documentation

## 2019-02-07 LAB — SARS CORONAVIRUS 2 BY RT PCR (HOSPITAL ORDER, PERFORMED IN ~~LOC~~ HOSPITAL LAB): SARS Coronavirus 2: NEGATIVE

## 2019-02-07 NOTE — MAU Note (Signed)
Swab collected without difficulty. 

## 2019-02-09 ENCOUNTER — Encounter (HOSPITAL_COMMUNITY): Payer: Self-pay | Admitting: *Deleted

## 2019-02-09 ENCOUNTER — Inpatient Hospital Stay (HOSPITAL_COMMUNITY): Payer: BC Managed Care – PPO | Admitting: Anesthesiology

## 2019-02-09 ENCOUNTER — Other Ambulatory Visit: Payer: Self-pay

## 2019-02-09 ENCOUNTER — Inpatient Hospital Stay (HOSPITAL_COMMUNITY)
Admission: AD | Admit: 2019-02-09 | Discharge: 2019-02-10 | DRG: 807 | Disposition: A | Discharge status: Home / Self Care | Payer: BC Managed Care – PPO | Attending: Obstetrics and Gynecology | Admitting: Obstetrics and Gynecology

## 2019-02-09 ENCOUNTER — Inpatient Hospital Stay (HOSPITAL_COMMUNITY): Payer: BC Managed Care – PPO

## 2019-02-09 DIAGNOSIS — O99354 Diseases of the nervous system complicating childbirth: Secondary | ICD-10-CM | POA: Diagnosis not present

## 2019-02-09 DIAGNOSIS — Z23 Encounter for immunization: Secondary | ICD-10-CM | POA: Diagnosis not present

## 2019-02-09 DIAGNOSIS — G40909 Epilepsy, unspecified, not intractable, without status epilepticus: Secondary | ICD-10-CM | POA: Diagnosis present

## 2019-02-09 DIAGNOSIS — O99820 Streptococcus B carrier state complicating pregnancy: Secondary | ICD-10-CM | POA: Diagnosis not present

## 2019-02-09 DIAGNOSIS — Z3A39 39 weeks gestation of pregnancy: Secondary | ICD-10-CM

## 2019-02-09 DIAGNOSIS — Z3490 Encounter for supervision of normal pregnancy, unspecified, unspecified trimester: Secondary | ICD-10-CM | POA: Diagnosis present

## 2019-02-09 DIAGNOSIS — Z412 Encounter for routine and ritual male circumcision: Secondary | ICD-10-CM | POA: Diagnosis not present

## 2019-02-09 LAB — CBC
HCT: 41 % (ref 36.0–46.0)
Hemoglobin: 13.8 g/dL (ref 12.0–15.0)
MCH: 30 pg (ref 26.0–34.0)
MCHC: 33.7 g/dL (ref 30.0–36.0)
MCV: 89.1 fL (ref 80.0–100.0)
Platelets: 205 10*3/uL (ref 150–400)
RBC: 4.6 MIL/uL (ref 3.87–5.11)
RDW: 12.6 % (ref 11.5–15.5)
WBC: 10.2 10*3/uL (ref 4.0–10.5)
nRBC: 0 % (ref 0.0–0.2)

## 2019-02-09 LAB — TYPE AND SCREEN
ABO/RH(D): O POS
Antibody Screen: NEGATIVE

## 2019-02-09 LAB — ABO/RH: ABO/RH(D): O POS

## 2019-02-09 LAB — RPR: RPR Ser Ql: NONREACTIVE

## 2019-02-09 MED ORDER — OXYTOCIN BOLUS FROM INFUSION
500.0000 mL | Freq: Once | INTRAVENOUS | Status: AC
  Administered 2019-02-09: 500 mL via INTRAVENOUS

## 2019-02-09 MED ORDER — ACETAMINOPHEN 325 MG PO TABS: 650.0000 mg | ORAL_TABLET | ORAL | Status: DC | PRN

## 2019-02-09 MED ORDER — LEVETIRACETAM 750 MG PO TABS
750.0000 mg | ORAL_TABLET | Freq: Two times a day (BID) | ORAL | Status: DC
  Administered 2019-02-09 – 2019-02-10 (×2): 750 mg via ORAL
  Filled 2019-02-09 (×3): qty 1

## 2019-02-09 MED ORDER — ONDANSETRON HCL 4 MG/2ML IJ SOLN: 4.0000 mg | Freq: Four times a day (QID) | INTRAMUSCULAR | Status: DC | PRN

## 2019-02-09 MED ORDER — PENICILLIN G 3 MILLION UNITS IVPB - SIMPLE MED
3.0000 10*6.[IU] | INTRAVENOUS | Status: DC
  Administered 2019-02-09 (×2): 3 10*6.[IU] via INTRAVENOUS
  Filled 2019-02-09 (×2): qty 100

## 2019-02-09 MED ORDER — PHENYLEPHRINE 40 MCG/ML (10ML) SYRINGE FOR IV PUSH (FOR BLOOD PRESSURE SUPPORT): 80.0000 ug | PREFILLED_SYRINGE | INTRAVENOUS | Status: DC | PRN

## 2019-02-09 MED ORDER — DIBUCAINE (PERIANAL) 1 % EX OINT
1.0000 "application " | TOPICAL_OINTMENT | CUTANEOUS | Status: DC | PRN
  Administered 2019-02-10: 1 via RECTAL
  Filled 2019-02-09: qty 28

## 2019-02-09 MED ORDER — DIPHENHYDRAMINE HCL 50 MG/ML IJ SOLN: 12.5000 mg | INTRAMUSCULAR | Status: DC | PRN

## 2019-02-09 MED ORDER — LIDOCAINE HCL (PF) 1 % IJ SOLN
INTRAMUSCULAR | Status: DC | PRN
  Administered 2019-02-09: 6 mL via EPIDURAL

## 2019-02-09 MED ORDER — WITCH HAZEL-GLYCERIN EX PADS: 1.0000 "application " | MEDICATED_PAD | CUTANEOUS | Status: DC | PRN

## 2019-02-09 MED ORDER — FERROUS SULFATE 325 (65 FE) MG PO TABS
325.0000 mg | ORAL_TABLET | Freq: Two times a day (BID) | ORAL | Status: DC
  Administered 2019-02-10 (×2): 325 mg via ORAL
  Filled 2019-02-09 (×2): qty 1

## 2019-02-09 MED ORDER — ONDANSETRON HCL 4 MG PO TABS: 4.0000 mg | ORAL_TABLET | ORAL | Status: DC | PRN

## 2019-02-09 MED ORDER — OXYTOCIN 40 UNITS IN NORMAL SALINE INFUSION - SIMPLE MED
1.0000 m[IU]/min | INTRAVENOUS | Status: DC
  Administered 2019-02-09: 2 m[IU]/min via INTRAVENOUS

## 2019-02-09 MED ORDER — OXYCODONE HCL 5 MG PO TABS: 10.0000 mg | ORAL_TABLET | ORAL | Status: DC | PRN

## 2019-02-09 MED ORDER — TERBUTALINE SULFATE 1 MG/ML IJ SOLN: 0.2500 mg | Freq: Once | INTRAMUSCULAR | Status: DC | PRN

## 2019-02-09 MED ORDER — OXYTOCIN 40 UNITS IN NORMAL SALINE INFUSION - SIMPLE MED
2.5000 [IU]/h | INTRAVENOUS | Status: DC
  Administered 2019-02-09: 2.5 [IU]/h via INTRAVENOUS
  Filled 2019-02-09: qty 1000

## 2019-02-09 MED ORDER — SIMETHICONE 80 MG PO CHEW
80.0000 mg | CHEWABLE_TABLET | ORAL | Status: DC | PRN
  Administered 2019-02-10: 80 mg via ORAL
  Filled 2019-02-09: qty 1

## 2019-02-09 MED ORDER — IBUPROFEN 600 MG PO TABS
600.0000 mg | ORAL_TABLET | Freq: Four times a day (QID) | ORAL | Status: DC
  Administered 2019-02-09 – 2019-02-10 (×4): 600 mg via ORAL
  Filled 2019-02-09 (×5): qty 1

## 2019-02-09 MED ORDER — SOD CITRATE-CITRIC ACID 500-334 MG/5ML PO SOLN: 30.0000 mL | ORAL | Status: DC | PRN

## 2019-02-09 MED ORDER — EPHEDRINE 5 MG/ML INJ: 10.0000 mg | INTRAVENOUS | Status: DC | PRN

## 2019-02-09 MED ORDER — BENZOCAINE-MENTHOL 20-0.5 % EX AERO
1.0000 "application " | INHALATION_SPRAY | CUTANEOUS | Status: DC | PRN
  Administered 2019-02-09: 1 via TOPICAL
  Filled 2019-02-09: qty 56

## 2019-02-09 MED ORDER — FENTANYL-BUPIVACAINE-NACL 0.5-0.125-0.9 MG/250ML-% EP SOLN: 12.0000 mL/h | EPIDURAL | Status: DC | PRN

## 2019-02-09 MED ORDER — MISOPROSTOL 50MCG HALF TABLET
50.0000 ug | ORAL_TABLET | ORAL | Status: DC
  Administered 2019-02-09: 50 ug via ORAL
  Filled 2019-02-09 (×2): qty 1

## 2019-02-09 MED ORDER — LACTATED RINGERS IV SOLN: 500.0000 mL | Freq: Once | INTRAVENOUS | Status: DC

## 2019-02-09 MED ORDER — PHENYLEPHRINE 40 MCG/ML (10ML) SYRINGE FOR IV PUSH (FOR BLOOD PRESSURE SUPPORT)
PREFILLED_SYRINGE | INTRAVENOUS | Status: AC
  Filled 2019-02-09: qty 10

## 2019-02-09 MED ORDER — SODIUM CHLORIDE (PF) 0.9 % IJ SOLN
INTRAMUSCULAR | Status: DC | PRN
  Administered 2019-02-09: 12 mL/h via EPIDURAL

## 2019-02-09 MED ORDER — SODIUM CHLORIDE 0.9 % IV SOLN
5.0000 10*6.[IU] | Freq: Once | INTRAVENOUS | Status: AC
  Administered 2019-02-09: 5 10*6.[IU] via INTRAVENOUS
  Filled 2019-02-09: qty 5

## 2019-02-09 MED ORDER — OXYCODONE HCL 5 MG PO TABS: 5.0000 mg | ORAL_TABLET | ORAL | Status: DC | PRN

## 2019-02-09 MED ORDER — PRENATAL MULTIVITAMIN CH
1.0000 | ORAL_TABLET | Freq: Every day | ORAL | Status: DC
  Administered 2019-02-10: 1 via ORAL
  Filled 2019-02-09: qty 1

## 2019-02-09 MED ORDER — LACTATED RINGERS IV SOLN
500.0000 mL | INTRAVENOUS | Status: DC | PRN
  Administered 2019-02-09: 1000 mL via INTRAVENOUS

## 2019-02-09 MED ORDER — LACTATED RINGERS IV SOLN
INTRAVENOUS | Status: DC
  Administered 2019-02-09 (×3): via INTRAVENOUS

## 2019-02-09 MED ORDER — COCONUT OIL OIL: 1.0000 "application " | TOPICAL_OIL | Status: DC | PRN

## 2019-02-09 MED ORDER — DIPHENHYDRAMINE HCL 25 MG PO CAPS: 25.0000 mg | ORAL_CAPSULE | Freq: Four times a day (QID) | ORAL | Status: DC | PRN

## 2019-02-09 MED ORDER — ZOLPIDEM TARTRATE 5 MG PO TABS: 5.0000 mg | ORAL_TABLET | Freq: Every evening | ORAL | Status: DC | PRN

## 2019-02-09 MED ORDER — ONDANSETRON HCL 4 MG/2ML IJ SOLN: 4.0000 mg | INTRAMUSCULAR | Status: DC | PRN

## 2019-02-09 MED ORDER — SENNOSIDES-DOCUSATE SODIUM 8.6-50 MG PO TABS
2.0000 | ORAL_TABLET | ORAL | Status: DC
  Administered 2019-02-09: 2 via ORAL
  Filled 2019-02-09: qty 2

## 2019-02-09 MED ORDER — FENTANYL-BUPIVACAINE-NACL 0.5-0.125-0.9 MG/250ML-% EP SOLN
EPIDURAL | Status: AC
  Filled 2019-02-09: qty 250

## 2019-02-09 MED ORDER — LIDOCAINE HCL (PF) 1 % IJ SOLN
30.0000 mL | INTRAMUSCULAR | Status: AC | PRN
  Administered 2019-02-09: 30 mL via SUBCUTANEOUS
  Filled 2019-02-09: qty 30

## 2019-02-09 NOTE — Lactation Note (Signed)
This note was copied from a baby's chart. Lactation Consultation Note  Patient Name: Lori Alvarado [id number] Date: 02/09/2019 Reason for consult: Initial assessment;Term  4 hours old FT female who is being exclusively BF by his mother, she's a P2 but not very experienced BF. She BF her first child for 2 weeks and said BF was difficult but didn't explain further when asked questions about what was the main BF difficulty. Revised hand expression with mom and she was able to get small droplets of colostrum, mom trying to latch baby when entering the room. She has a Medela DEBP at home.  Offered assistance with latch and mom agreed to unswaddle baby. LC took baby STS to mother's left breast but baby would just cry and wouldn't latch on. MD note mentioned that baby has a bruise on the tip of his tongue but he would do some sucking on a gloved finger and then start crying again.   Mom decided to just to STS with baby at this time. Asked mom to call for assistance when needed. A feeding attempt was documented in [place]. Reviewed normal newborn behavior, feeding cues and cluster feeding.   Feeding plan:  1. Encouraged mom to feed baby STS 8-12 times/24 hours or sooner if feeding cues are present 2. Hand expression and spoon feeding were also encouraged  BF brochure, BF resources and feeding diary were reviewed. Parents reported all questions and concerns were answered, they're both aware of [place] OP services and will call PRN.  Maternal Data Formula Feeding for Exclusion: No Has patient been taught Hand Expression?: Yes Does the patient have breastfeeding experience prior to this delivery?: Yes  Feeding Feeding Type: Breast Fed  LATCH Score Latch: Repeated attempts needed to sustain latch, nipple held in mouth throughout feeding, stimulation needed to elicit sucking reflex.  Audible Swallowing: Spontaneous and intermittent  Type of Nipple: Everted at rest and after stimulation  Comfort  (Breast/Nipple): Soft / non-tender  Hold (Positioning): Assistance needed to correctly position infant at breast and maintain latch.  LATCH Score: 8  Interventions Interventions: Breast feeding basics reviewed;Assisted with latch;Skin to skin;Breast massage;Hand express;Breast compression;Adjust position;Support pillows  Lactation Tools Discussed/Used WIC Program: No   Consult Status Consult Status: Follow-up Date: 02/10/19 Follow-up type: In-patient    Wash Nienhaus Francene Boyers 02/09/2019, 9:46 PM

## 2019-02-09 NOTE — Anesthesia Procedure Notes (Signed)
Epidural Patient location during procedure: OB Start time: 02/09/2019 11:44 AM End time: 02/09/2019 11:47 AM  Staffing Anesthesiologist: Janeece Riggers, MD  Preanesthetic Checklist Completed: patient identified, site marked, surgical consent, pre-op evaluation, timeout performed, IV checked, risks and benefits discussed and monitors and equipment checked  Epidural Patient position: sitting Prep: site prepped and draped and DuraPrep Patient monitoring: continuous pulse ox and blood pressure Approach: midline Location: L3-L4 Injection technique: LOR air  Needle:  Needle type: Tuohy  Needle gauge: 17 G Needle length: 9 cm and 9 Needle insertion depth: 5 cm cm Catheter type: closed end flexible Catheter size: 19 Gauge Catheter at skin depth: 10 cm Test dose: negative  Assessment Events: blood not aspirated, injection not painful, no injection resistance, negative IV test and no paresthesia

## 2019-02-09 NOTE — Progress Notes (Signed)
IFSE removed with tip intact 

## 2019-02-09 NOTE — Anesthesia Preprocedure Evaluation (Addendum)
Anesthesia Evaluation  Patient identified by MRN, date of birth, ID band Patient awake    Reviewed: Allergy & Precautions, H&P , NPO status , Patient's Chart, lab work & pertinent test results, reviewed documented beta blocker date and time   Airway Mallampati: II  TM Distance: >3 FB Neck ROM: full    Dental no notable dental hx.    Pulmonary neg pulmonary ROS,    Pulmonary exam normal breath sounds clear to auscultation       Cardiovascular negative cardio ROS Normal cardiovascular exam Rhythm:regular Rate:Normal     Neuro/Psych  Headaches, Seizures -, Well Controlled,  negative psych ROS   GI/Hepatic negative GI ROS, Neg liver ROS,   Endo/Other  negative endocrine ROS  Renal/GU negative Renal ROS  negative genitourinary   Musculoskeletal   Abdominal   Peds  Hematology negative hematology ROS (+)   Anesthesia Other Findings   Reproductive/Obstetrics (+) Pregnancy                            Anesthesia Physical Anesthesia Plan  ASA: II  Anesthesia Plan: Epidural   Post-op Pain Management:    Induction:   PONV Risk Score and Plan:   Airway Management Planned:   Additional Equipment:   Intra-op Plan:   Post-operative Plan:   Informed Consent: I have reviewed the patients History and Physical, chart, labs and discussed the procedure including the risks, benefits and alternatives for the proposed anesthesia with the patient or authorized representative who has indicated his/her understanding and acceptance.     Dental Advisory Given  Plan Discussed with: Anesthesiologist and CRNA  Anesthesia Plan Comments: (Labs checked- platelets confirmed with RN in room. Fetal heart tracing, per RN, reported to be stable enough for sitting procedure. Discussed epidural, and patient consents to the procedure:  included risk of possible headache,backache, failed block, allergic reaction, and  nerve injury. This patient was asked if she had any questions or concerns before the procedure started.)        Anesthesia Quick Evaluation

## 2019-02-09 NOTE — Plan of Care (Signed)
  Problem: Education: Goal: Knowledge of General Education information will improve Description: Including pain rating scale, medication(s)/side effects and non-pharmacologic comfort measures Outcome: Completed/Met   Problem: Education: Goal: Knowledge of condition will improve Outcome: Completed/Met   Problem: Activity: Goal: Will verbalize the importance of balancing activity with adequate rest periods Outcome: Completed/Met Goal: Ability to tolerate increased activity will improve Outcome: Completed/Met   Problem: Life Cycle: Goal: Chance of risk for complications during the postpartum period will decrease Outcome: Completed/Met   Problem: Role Relationship: Goal: Ability to demonstrate positive interaction with newborn will improve Outcome: Completed/Met   Problem: Skin Integrity: Goal: Demonstration of wound healing without infection will improve Outcome: Completed/Met

## 2019-02-09 NOTE — H&P (Signed)
Lori Alvarado is a 38 y.o. female presenting @ term for IOL. PNC uncomplicated. Epilepsy controlled with Keppra. (+) GBS bacteruria  OB History    Gravida  2   Para  1   Term  1   Preterm      AB      Living  1     SAB      TAB      Ectopic      Multiple      Live Births  1          Past Medical History:  Diagnosis Date  . HSV-1 (herpes simplex virus 1) infection   . Migraine headache   . Seizure disorder [facility])    Past Surgical History:  Procedure Laterality Date  . NO PAST SURGERIES     Family History: family history includes Cancer (age of onset: 26) in her cousin; Heart disease in her maternal grandmother; Hypertension in her maternal aunt and maternal grandmother; Seizures in her maternal aunt. Social History:  reports that she has never smoked. She has never used smokeless tobacco. She reports current alcohol use. She reports that she does not use drugs.     Maternal Diabetes: No Genetic Screening: Normal Maternal Ultrasounds/Referrals: Normal Fetal Ultrasounds or other Referrals:  None Maternal Substance Abuse:  No Significant Maternal Medications:  Meds include: Other: keppra Significant Maternal Lab Results:  Group B Strep positive Other Comments:  epilepsy on med   Review of Systems  All other systems reviewed and are negative.  Maternal Medical History:  Fetal activity: Perceived fetal activity is normal.    Prenatal complications: epilepsy  Prenatal Complications - Diabetes: none.    Dilation: (ext 3/int 1) Effacement (%): 50 Station: -2 Exam by:: s grindstaff rn  Blood pressure 111/62, pulse 80, temperature 98.3 F (36.8 C), temperature source Oral, resp. rate 16, height 5' (1.524 m), weight 83.9 kg. Exam Physical Exam  Constitutional: She is oriented to person, place, and time. She appears well-developed and well-nourished.  Eyes: EOM are normal.  Neck: Normal range of motion.  Cardiovascular: Regular rhythm.  Respiratory:  Breath sounds normal.  Musculoskeletal:        General: No edema.  Neurological: She is alert and oriented to person, place, and time.  Skin: Skin is warm and dry.  Psychiatric: She has a normal mood and affect.    Prenatal labs: ABO, Rh: --/--/O POS (10/10 0744) Antibody: NEG (10/10 0744) Rubella: Immune (03/09 0000) RPR: Nonreactive (03/09 0000)  HBsAg: Negative (03/09 0000)  HIV: Non-reactive (03/09 0000)  GBS: Positive/-- (03/09 0000)   Assessment/Plan: Term gestation GBS cx (+)  Epilepsy  P) admit routine labs. Oral cytotec. Intracervical balloon done. epidural prn   Paarth Cropper A Ricka Westra 02/09/2019, 8:57 AM

## 2019-02-09 NOTE — Progress Notes (Signed)
Changed from IFSE to u/s

## 2019-02-09 NOTE — Anesthesia Postprocedure Evaluation (Signed)
Anesthesia Post Note  Patient: Lori Alvarado  Procedure(s) Performed: AN AD HOC LABOR EPIDURAL     Patient location during evaluation: Mother Baby Anesthesia Type: Epidural Level of consciousness: awake and alert Pain management: pain level controlled Vital Signs Assessment: post-procedure vital signs reviewed and stable Respiratory status: spontaneous breathing, nonlabored ventilation and respiratory function stable Cardiovascular status: stable Postop Assessment: no headache, no backache and epidural receding Anesthetic complications: no    Last Vitals:  Vitals:   02/09/19 1940 02/09/19 2027  BP:  123/60  Pulse:  91  Resp:  18  Temp: 37.8 C 37.2 C  SpO2:      Last Pain:  Vitals:   02/09/19 2027  TempSrc: Oral  PainSc:                  Araseli Sherry

## 2019-02-10 LAB — CBC
HCT: 38.3 % (ref 36.0–46.0)
Hemoglobin: 13.1 g/dL (ref 12.0–15.0)
MCH: 30.4 pg (ref 26.0–34.0)
MCHC: 34.2 g/dL (ref 30.0–36.0)
MCV: 88.9 fL (ref 80.0–100.0)
Platelets: 176 10*3/uL (ref 150–400)
RBC: 4.31 MIL/uL (ref 3.87–5.11)
RDW: 12.7 % (ref 11.5–15.5)
WBC: 17 10*3/uL — ABNORMAL HIGH (ref 4.0–10.5)
nRBC: 0 % (ref 0.0–0.2)

## 2019-02-10 MED ORDER — IBUPROFEN 600 MG PO TABS: 600.0000 mg | ORAL_TABLET | Freq: Four times a day (QID) | ORAL | 6 refills | Status: DC | PRN

## 2019-02-10 MED ORDER — OXYCODONE HCL 5 MG PO TABS: 5.0000 mg | ORAL_TABLET | ORAL | 0 refills | Status: AC | PRN

## 2019-02-10 NOTE — Discharge Instructions (Signed)
Call if temperature greater than equal to 100.4, nothing per vagina for 4-6 weeks or severe nausea vomiting, increased incisional pain , drainage or redness in the incision site, no straining with bowel movements, showers no bath Also see [facility] postpartum booklet

## 2019-02-10 NOTE — Lactation Note (Signed)
This note was copied from a baby's chart. Lactation Consultation Note  Patient Name: Lori Alvarado [id number] Date: 02/10/2019 Reason for consult: Follow-up assessment   Baby 16 hours and being circumcised. Feed on demand with cues.  Goal 8-12+ times per day after first 24 hrs.  Place baby STS if not cueing.  Reviewed engorgement care and monitoring voids/stools. Discussed cluster feeding.  Encouraged family to call for [facility] assistance as needed.    Maternal Data    Feeding Feeding Type: Breast Fed  LATCH Score Latch: Grasps breast easily, tongue down, lips flanged, rhythmical sucking.  Audible Swallowing: A few with stimulation  Type of Nipple: Everted at rest and after stimulation  Comfort (Breast/Nipple): Soft / non-tender  Hold (Positioning): No assistance needed to correctly position infant at breast.  LATCH Score: 9  Interventions Interventions: Breast feeding basics reviewed  Lactation Tools Discussed/Used     Consult Status Consult Status: Complete Date: 02/10/19    Vivianne Master Boschen 02/10/2019, 10:00 AM

## 2019-02-10 NOTE — Progress Notes (Signed)
`  VAVD PPD #1  S: desires early discharge. Feels well  O: BP 102/60   Pulse 67   Temp 97.7 F (36.5 C) (Oral)   Resp 18   Ht 5' (1.524 m)   Wt 83.9 kg   SpO2 100%   Breastfeeding Unknown   BMI 36.13 kg/m  Lungs clear to A  cor RRR  Breast full abd soft uterus 2 FB below umb Pad mod lochia Perineum well approximated no edema Ext no edema CBC Latest Ref Rng & Units 02/10/2019 02/09/2019 02/25/2014  WBC 4.0 - 10.5 K/uL 17.0(H) 10.2 5.5  Hemoglobin 12.0 - 15.0 g/dL 13.1 13.8 14.6  Hematocrit 36.0 - 46.0 % 38.3 41.0 43.6  Platelets 150 - 400 K/uL 176 205 216  IMP: PPD #1 doing well  epilepsy controlled P) d/c home d/c instructions reviewed. Cont with keppra, folic acid.  f/u 6 wk  WOB pp booklet given

## 2019-02-10 NOTE — Discharge Summary (Signed)
Obstetric Discharge Summary Reason for Admission: induction of labor Prenatal Procedures: ultrasound Intrapartum Procedures: vacuum and episiotomy midline Postpartum Procedures: none Complications-Operative and Postpartum: none Hemoglobin  Date Value Ref Range Status  02/10/2019 13.1 12.0 - 15.0 g/dL Final   HCT  Date Value Ref Range Status  02/10/2019 38.3 36.0 - 46.0 % Final    Physical Exam:  General: alert, cooperative and no distress Lochia: appropriate Uterine Fundus: firm Incision: n/a DVT Evaluation: No evidence of DVT seen on physical exam.  Discharge Diagnoses: Term Pregnancy-delivered and epilepsy  Discharge Information: Date: 02/10/2019 Activity: pelvic rest Diet: routine Medications: PNV, Ibuprofen and roxicet, folic acid, keppra Condition: stable Instructions: refer to practice specific booklet Discharge to: home Follow-up Information    Servando Salina, MD Follow up in 6 week(s).   Specialty: Obstetrics and Gynecology Contact information: [address] [place] [address] [phone]           Newborn Data: Live born female  Birth Weight: 8 lb 8.3 oz (3865 g) APGAR: 85, 9  Newborn Delivery   Birth date/time: 02/09/2019 17:26:00 Delivery type: Vaginal, Vacuum (Extractor)    circ done  Home with mother.  Dynastee Brummell A Athalee Esterline 02/10/2019, 9:58 AM

## 2019-03-05 ENCOUNTER — Encounter: Payer: Self-pay | Admitting: Adult Health

## 2019-03-06 ENCOUNTER — Other Ambulatory Visit: Payer: Self-pay

## 2019-03-06 DIAGNOSIS — Z20822 Contact with and (suspected) exposure to covid-19: Secondary | ICD-10-CM

## 2019-03-07 LAB — NOVEL CORONAVIRUS, NAA: SARS-CoV-2, NAA: NOT DETECTED

## 2019-03-07 MED ORDER — TOPIRAMATE 25 MG PO TABS: ORAL_TABLET | ORAL | 5 refills | Status: DC

## 2019-03-07 NOTE — Addendum Note (Signed)
Addended by: Trudie Buckler on: 03/07/2019 11:43 AM   Modules accepted: Orders

## 2019-04-19 DIAGNOSIS — Z130 Encounter for screening for diseases of the blood and blood-forming organs and certain disorders involving the immune mechanism: Secondary | ICD-10-CM | POA: Diagnosis not present

## 2019-05-07 ENCOUNTER — Telehealth: Payer: Self-pay | Admitting: Adult Health

## 2019-05-07 NOTE — Telephone Encounter (Signed)
I called patient regarding rescheduling her 1/11 appointment d/t MM out of office. Patient states she is tolerating 50 mg of Topamax well and is wanting to increase the dosage to 75 mg. I advised patient I would give this message to [facility] I rescheduled patient's appointment to March, next available.

## 2019-05-08 MED ORDER — TOPIRAMATE 25 MG PO TABS: 75.0000 mg | ORAL_TABLET | Freq: Every day | ORAL | 5 refills | Status: DC

## 2019-05-08 NOTE — Telephone Encounter (Signed)
Topamax have been increased to 75 mg daily.  Please make patient aware

## 2019-05-08 NOTE — Telephone Encounter (Signed)
Unable to get in contact with the patient. LVM letting her know that her Topamax has been increased and new script has been sent to the pharmacy. Office number was provided in case she has any questions or concerns.

## 2019-05-08 NOTE — Addendum Note (Signed)
Addended by: Trudie Buckler on: 05/08/2019 01:08 PM   Modules accepted: Orders

## 2019-05-13 ENCOUNTER — Ambulatory Visit: Payer: BLUE CROSS/BLUE SHIELD | Admitting: Adult Health

## 2019-07-02 DIAGNOSIS — L918 Other hypertrophic disorders of the skin: Secondary | ICD-10-CM | POA: Diagnosis not present

## 2019-07-02 DIAGNOSIS — D2262 Melanocytic nevi of left upper limb, including shoulder: Secondary | ICD-10-CM | POA: Diagnosis not present

## 2019-07-02 DIAGNOSIS — D2221 Melanocytic nevi of right ear and external auricular canal: Secondary | ICD-10-CM | POA: Diagnosis not present

## 2019-07-02 DIAGNOSIS — D225 Melanocytic nevi of trunk: Secondary | ICD-10-CM | POA: Diagnosis not present

## 2019-07-11 ENCOUNTER — Other Ambulatory Visit: Payer: Self-pay | Admitting: Adult Health

## 2019-07-18 ENCOUNTER — Other Ambulatory Visit: Payer: Self-pay

## 2019-07-18 ENCOUNTER — Encounter: Payer: Self-pay | Admitting: Adult Health

## 2019-07-18 ENCOUNTER — Ambulatory Visit: Payer: BC Managed Care – PPO | Admitting: Adult Health

## 2019-07-18 VITALS — BP 121/71 | HR 62 | Temp 97.4°F | Ht 61.0 in | Wt 163.0 lb

## 2019-07-18 DIAGNOSIS — G43709 Chronic migraine without aura, not intractable, without status migrainosus: Secondary | ICD-10-CM

## 2019-07-18 DIAGNOSIS — R569 Unspecified convulsions: Secondary | ICD-10-CM

## 2019-07-18 NOTE — Progress Notes (Signed)
I have read the note, and I agree with the clinical assessment and plan.  Larrisha Babineau K Tinsley Lomas   

## 2019-07-18 NOTE — Progress Notes (Signed)
PATIENT: Lori Alvarado DOB: 1981-04-24  REASON FOR VISIT: follow up HISTORY FROM: patient  HISTORY OF PRESENT ILLNESS: Today 07/18/19 Lori Alvarado is a 39 year old female with a history of seizures and migraine headaches.  She returns today for follow-up.  She remains on Keppra 750 mg twice a day.  Denies any seizure events.  Her Topamax was increased to 75 mg.  She reports that her headaches have improved.  HISTORYMs. Alvarado is a 39 year old female with a history of seizures and headaches.  She returns today for follow-up.  She remains on Keppra and is tolerating it well.  She denies any seizure events.  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  She reports that her headaches are manageable.  She states that she continues to have that sharp shooting pain in the head that lasts for several seconds at least every other day.  But she does not wish to be on any medication at this time.   She returns today for evaluation.     REVIEW OF SYSTEMS: Out of a complete 14 system review of symptoms, the patient complains only of the following symptoms, and all other reviewed systems are negative.  See HPI  ALLERGIES: Allergies  Allergen Reactions  . Lamictal [Lamotrigine] Other (See Comments)    Made her feel crazy    HOME MEDICATIONS: Outpatient Medications Prior to Visit  Medication Sig Dispense Refill  . folic acid (FOLVITE) 1 MG tablet TAKE 1 TABLET(1 MG) BY MOUTH DAILY 30 tablet 0  . ibuprofen (ADVIL) 600 MG tablet Take 1 tablet (600 mg total) by mouth every 6 (six) hours as needed for moderate pain. 30 tablet 6  . levETIRAcetam (KEPPRA) 750 MG tablet TAKE 1 TABLET(750 MG) BY MOUTH TWICE DAILY 60 tablet 0  . topiramate (TOPAMAX) 25 MG tablet Take 3 tablets (75 mg total) by mouth daily. 90 tablet 5   No facility-administered medications prior to visit.    PAST MEDICAL HISTORY: Past Medical History:  Diagnosis Date  . HSV-1 (herpes  simplex virus 1) infection   . Migraine headache   . Seizure disorder ([place])     PAST SURGICAL HISTORY: Past Surgical History:  Procedure Laterality Date  . NO PAST SURGERIES      FAMILY HISTORY: Family History  Problem Relation Age of Onset  . Hypertension Maternal Grandmother   . Heart disease Maternal Grandmother   . Cancer Cousin 21       DIED WITH LIVER/LUNG CANCER  . Hypertension Maternal Aunt   . Seizures Maternal Aunt     SOCIAL HISTORY: Social History   Socioeconomic History  . Marital status: Married    Spouse name: Not on file  . Number of children: 1  . Years of education: college  . Highest education level: Not on file  Occupational History    Employer: [employer]  Tobacco Use  . Smoking status: Never Smoker  . Smokeless tobacco: Never Used  Substance and Sexual Activity  . Alcohol use: Yes    Alcohol/week: 0.0 standard drinks    Comment: occ  . Drug use: No  . Sexual activity: Yes    Partners: Male    Birth control/protection: None    Comment: INTERCOURSE AGE 56, EXUAL PARTNERS LESS TRHAN 5  Other Topics Concern  . Not on file  Social History Narrative  . Not on file   Social Determinants of Health   Financial Resource Strain:   .  Difficulty of Paying Living Expenses:   Food Insecurity:   . Worried About Charity fundraiser in the Last Year:   . Arboriculturist in the Last Year:   Transportation Needs:   . Film/video editor (Medical):   Marland Kitchen Lack of Transportation (Non-Medical):   Physical Activity:   . Days of Exercise per Week:   . Minutes of Exercise per Session:   Stress:   . Feeling of Stress :   Social Connections:   . Frequency of Communication with Friends and Family:   . Frequency of Social Gatherings with Friends and Family:   . Attends Religious Services:   . Active Member of Clubs or Organizations:   . Attends Archivist Meetings:   Marland Kitchen Marital Status:   Intimate Partner Violence:   . Fear of Current or  Ex-Partner:   . Emotionally Abused:   Marland Kitchen Physically Abused:   . Sexually Abused:       PHYSICAL EXAM  Vitals:   07/18/19 0905  BP: 121/71  Pulse: 62  Temp: (!) 97.4 F (36.3 C)  Weight: 163 lb (73.9 kg)  Height: 5\' 1"  (1.549 m)   Body mass index is 30.8 kg/m.  Generalized: Well developed, in no acute distress   Neurological examination  Mentation: Alert oriented to time, place, history taking. Follows all commands speech and language fluent Cranial nerve II-XII: Pupils were equal round reactive to light. Extraocular movements were full, visual field were full on confrontational test. Head turning and shoulder shrug  were normal and symmetric. Motor: The motor testing reveals 5 over 5 strength of all 4 extremities. Good symmetric motor tone is noted throughout.  Sensory: Sensory testing is intact to soft touch on all 4 extremities. No evidence of extinction is noted.  Coordination: Cerebellar testing reveals good finger-nose-finger and heel-to-shin bilaterally.  Gait and station: Gait is normal.   Reflexes: Deep tendon reflexes are symmetric and normal bilaterally.   DIAGNOSTIC DATA (LABS, IMAGING, TESTING) - I reviewed patient records, labs, notes, testing and imaging myself where available.  Lab Results  Component Value Date   WBC 17.0 (H) 02/10/2019   HGB 13.1 02/10/2019   HCT 38.3 02/10/2019   MCV 88.9 02/10/2019   PLT 176 02/10/2019      Component Value Date/Time   NA 139 02/25/2014 1545   K 4.2 02/25/2014 1545   CL 105 02/25/2014 1545   CO2 20 02/25/2014 1545   GLUCOSE 86 02/25/2014 1545   BUN 8 02/25/2014 1545   CREATININE 0.66 02/25/2014 1545   CALCIUM 9.0 02/25/2014 1545   PROT 7.8 02/25/2014 1545   ALBUMIN 4.3 02/25/2014 1545   AST 24 02/25/2014 1545   ALT 9 02/25/2014 1545   ALKPHOS 73 02/25/2014 1545   BILITOT 0.5 02/25/2014 1545   GFRNONAA >90 02/25/2014 1545   GFRAA >90 02/25/2014 1545   Lab Results  Component Value Date   CHOL 139  06/29/2012   HDL 56 06/29/2012   LDLCALC 73 06/29/2012   TRIG 50 06/29/2012   CHOLHDL 2.5 06/29/2012   No results found for: HGBA1C No results found for: VITAMINB12 No results found for: TSH    ASSESSMENT AND PLAN 39 y.o. year old female  has a past medical history of HSV-1 (herpes simplex virus 1) infection, Migraine headache, and Seizure disorder ([place]). here with:  1.  Migraine headache  -Continue Topamax 75 mg daily  2.  Seizures  -Continue Keppra 750 mg twice a day  Advised if her symptoms worsen or she develops new symptoms she should let us know.  She will follow-up in 1 year or sooner if needed   I spent 15 minutes of face-to-face and non-face-to-face time with patient.  This included previsit chart review, lab review, study review, order entry, electronic health record documentation, patient education.  Ward Givens, MSN, NP-C 07/18/2019, 8:49 AM [facility] [address], [place], [place] [address] [phone]

## 2019-07-18 NOTE — Patient Instructions (Signed)
1.  Migraine headache  -Continue Topamax 75 mg daily  2.  Seizures  -Continue Keppra 750 mg twice a day

## 2019-08-07 ENCOUNTER — Other Ambulatory Visit: Payer: Self-pay | Admitting: Adult Health

## 2019-11-13 ENCOUNTER — Other Ambulatory Visit: Payer: Self-pay | Admitting: Adult Health

## 2020-04-06 DIAGNOSIS — G40909 Epilepsy, unspecified, not intractable, without status epilepticus: Secondary | ICD-10-CM | POA: Diagnosis not present

## 2020-04-06 DIAGNOSIS — Z01419 Encounter for gynecological examination (general) (routine) without abnormal findings: Secondary | ICD-10-CM | POA: Diagnosis not present

## 2020-04-06 DIAGNOSIS — N6322 Unspecified lump in the left breast, upper inner quadrant: Secondary | ICD-10-CM | POA: Diagnosis not present

## 2020-04-13 DIAGNOSIS — N644 Mastodynia: Secondary | ICD-10-CM | POA: Diagnosis not present

## 2020-05-11 ENCOUNTER — Other Ambulatory Visit: Payer: Self-pay | Admitting: *Deleted

## 2020-05-11 MED ORDER — TOPIRAMATE 25 MG PO TABS: ORAL_TABLET | ORAL | 6 refills | Status: DC

## 2020-07-16 ENCOUNTER — Ambulatory Visit: Payer: BC Managed Care – PPO | Admitting: Adult Health

## 2020-07-16 ENCOUNTER — Encounter: Payer: Self-pay | Admitting: Adult Health

## 2020-07-16 ENCOUNTER — Other Ambulatory Visit: Payer: Self-pay

## 2020-07-16 VITALS — BP 110/70 | HR 66 | Ht 60.0 in | Wt 151.0 lb

## 2020-07-16 DIAGNOSIS — G43709 Chronic migraine without aura, not intractable, without status migrainosus: Secondary | ICD-10-CM

## 2020-07-16 DIAGNOSIS — R569 Unspecified convulsions: Secondary | ICD-10-CM | POA: Diagnosis not present

## 2020-07-16 DIAGNOSIS — G4485 Primary stabbing headache: Secondary | ICD-10-CM

## 2020-07-16 MED ORDER — TOPIRAMATE 25 MG PO TABS: ORAL_TABLET | ORAL | 3 refills | Status: DC

## 2020-07-16 MED ORDER — LEVETIRACETAM 750 MG PO TABS: ORAL_TABLET | ORAL | 3 refills | Status: DC

## 2020-07-16 NOTE — Patient Instructions (Signed)
Your Plan:  Continue Topamax 75 mg at bedtime Trial Melatonin 3-5 mg 2 hours before bedtime to see if it helps with headaches Consider indomethacin in the future If your symptoms worsen or you develop new symptoms please let us know.    Thank you for coming to see [place] at [facility]. I hope we have been able to provide you high quality care today.  You may receive a patient satisfaction survey over the next few weeks. We would appreciate your feedback and comments so that we may continue to improve ourselves and the health of our patients.  Indomethacin Oral Capsules What is this medicine? INDOMETHACIN (in doe METH a sin) is a non-steroidal anti-inflammatory drug, also known as an NSAID. It treats pain, inflammation, and swelling. This medicine may be used for other purposes; ask your health care provider or pharmacist if you have questions. COMMON BRAND NAME(S): Indocin, TIVORBEX What should I tell my health care provider before I take this medicine? They need to know if you have any of these conditions:  bleeding disorder  coronary artery bypass graft (CABG) within the past 2 weeks  dehydration  depression  heart attack  heart disease  heart failure  high blood pressure  if you often drink alcohol  kidney disease  liver disease  lung or breathing disease (asthma)  Parkinson's disease  receiving steroids like dexamethasone or prednisone  seizures  smoke tobacco cigarettes  stomach bleeding  stomach ulcers, other stomach or intestine problems  take drugs that treat or prevent blood clots  an unusual or allergic reaction to indomethacin, other medicines, foods, dyes, or preservatives  pregnant or trying to get pregnant  breast-feeding How should I use this medicine? Take this drug by mouth. Take it as directed on the prescription label at the same time every day. You can take it with or without food. If it upsets your stomach, take it  with food. Keep taking it unless your health care provider tells you to stop. A special MedGuide will be given to you by the pharmacist with each prescription and refill. Be sure to read this information carefully each time. Talk to your health care provider about the use of this drug in children. While it may be prescribed for children as young as 53 for selected conditions, precautions do apply. Patients over 64 years of age may have a stronger reaction and need a smaller dose. Overdosage: If you think you have taken too much of this medicine contact a poison control center or emergency room at once. NOTE: This medicine is only for you. Do not share this medicine with others. What if I miss a dose? If you miss a dose, take it as soon as you can. If it is almost time for your next dose, take only that dose. Do not take double or extra doses. What may interact with this medicine? Do not take this medicine with any of the following medications:  cidofovir  diflunisal  ketorolac  methotrexate  pemetrexed  triamterene This medicine may also interact with the following medications:  alcohol  antacids  aspirin and aspirin-like medicines  cyclosporine  digoxin  diuretics  lithium  medicines for diabetes  medicines for high blood pressure  medicines that affect platelets  medicines that treat or prevent blood clots like warfarin  NSAIDs, medicines for pain and inflammation, like ibuprofen or naproxen  probenecid  steroid medicines like prednisone or cortisone This list may not describe all possible interactions. Give your  health care provider a list of all the medicines, herbs, non-prescription drugs, or dietary supplements you use. Also tell them if you smoke, drink alcohol, or use illegal drugs. Some items may interact with your medicine. What should I watch for while using this medicine? Visit your health care provider for regular checks on your progress. Tell your  health care provider if your symptoms do not start to get better or if they get worse. Do not take other medicines that contain aspirin, ibuprofen, or naproxen with this medicine. Side effects such as stomach upset, nausea, or ulcers may be more likely to occur. Many non-prescription medicines contain aspirin, ibuprofen, or naproxen. Always read labels carefully. This medicine can cause serious ulcers and bleeding in the stomach. It can happen with no warning. Smoking, drinking alcohol, older age, and poor health can also increase risks. Call your health care provider right away if you have stomach pain or blood in your vomit or stool. This medicine does not prevent a heart attack or stroke. This medicine may increase the chance of a heart attack or stroke. The chance may increase the longer you use this medicine or if you have heart disease. If you take aspirin to prevent a heart attack or stroke, talk to your health care provider about using this medicine. Alcohol may interfere with the effect of this medicine. Avoid alcoholic drinks. This medicine may cause serious skin reactions. They can happen weeks to months after starting the medicine. Contact your health care provider right away if you notice fevers or flu-like symptoms with a rash. The rash may be red or purple and then turn into blisters or peeling of the skin. Or, you might notice a red rash with swelling of the face, lips or lymph nodes in your neck or under your arms. Talk to your health care provider if you are pregnant before taking this medicine. Taking this medicine between weeks 20 and 30 of pregnancy may harm your unborn baby. Your health care provider will monitor you closely if you need to take it. After 30 weeks of pregnancy, do not take this medicine. You may get drowsy or dizzy. Do not drive, use machinery, or do anything that needs mental alertness until you know how this medicine affects you. Do not stand up or sit up quickly,  especially if you are an older patient. This reduces the risk of dizzy or fainting spells. Be careful brushing or flossing your teeth or using a toothpick because you may get an infection or bleed more easily. If you have any dental work done, tell your dentist you are receiving this medicine. This medicine may make it more difficult to get pregnant. Talk to your health care provider if you are concerned about your fertility. What side effects may I notice from receiving this medicine? Side effects that you should report to your doctor or health care provider as soon as possible:  allergic reactions (skin rash, itching or hives; swelling of the face, lips, or tongue)  blurred vision OR changes in vision  heart attack (trouble breathing; pain or tightness in the chest, neck, back or arms; unusually weak or tired)  heart failure (trouble breathing; fast, irregular heartbeat; sudden weight gain; swelling of the ankles, feet, hands; unusually weak or tired)  high potassium levels (chest pain; fast, irregular heartbeat; muscle weakness)  increase in blood pressure  kidney injury (trouble passing urine or change in the amount of urine)  liver injury (dark yellow or brown urine; general  ill feeling or flu-like symptoms; loss of appetite, right upper belly pain; unusually weak or tired, yellowing of the eyes or skin)  low red blood cell counts (trouble breathing; feeling faint; lightheaded, falls; unusually weak or tired)  rash, fever, and swollen lymph nodes  redness, blistering, peeling, or loosening of the skin, including inside the mouth  seizures  stomach bleeding (bloody or black, tarry stools; spitting up blood or brown material that looks like coffee grounds)  stroke (changes in vision; confusion; trouble speaking or understanding; severe headaches; sudden numbness or weakness of the face, arm or leg; trouble walking; dizziness; loss of balance or coordination) Side effects that  usually do not require medical attention (report to your doctor or health care provider if they continue or are bothersome):  depressed mood  diarrhea  excessive sweating  facial flushing (redness)  headache  upset stomach This list may not describe all possible side effects. Call your doctor for medical advice about side effects. You may report side effects to FDA at 1-800-FDA-1088. Where should I keep my medicine? Keep out of the reach of children and pets. Store at room temperature between 15 and 30 degrees C (59 and 86 degrees F). Protect from light and moisture. Keep the container tightly closed. Get rid of any unused medicine after the expiration date. To get rid of medicines that are no longer needed or have expired:  Take the medicine to a medicine take-back program. Check with your pharmacy or law enforcement to find a location.  If you cannot return the medicine, check the label or package insert to see if the medicine should be thrown out in the garbage or flushed down the toilet. If you are not sure, ask your health care provider. If it is safe to put it in the trash, empty the medicine out of the container. Mix the medicine with cat litter, dirt, coffee grounds, or other unwanted substance. Seal the mixture in a bag or container. Put it in the trash. NOTE: This sheet is a summary. It may not cover all possible information. If you have questions about this medicine, talk to your doctor, pharmacist, or health care provider.  2021 Elsevier/Gold Standard (2019-09-13 12:39:08)

## 2020-07-16 NOTE — Progress Notes (Signed)
I have read the note, and I agree with the clinical assessment and plan.  Lori Alvarado Lori Alvarado   

## 2020-07-16 NOTE — Progress Notes (Signed)
PATIENT: Lori Alvarado DOB: 09-24-1980  REASON FOR VISIT: follow up HISTORY FROM: patient  HISTORY OF PRESENT ILLNESS: Today 07/16/20   Lori Alvarado is a 40 year old female with a history of seizures and migraine headaches.  She returns today for follow-up.  She remains on Keppra 750 mg twice a day.  Denies any seizure events.  She remains on Topamax 75 mg twice a day.  She states that she continues to get daily ice pick headaches.  She reports that they can occur in random location.  Only lasts for several seconds.  She denies any other symptoms.  Denies having a runny nose or watery eyes during these events.  07/18/19: Lori Alvarado is a 40 year old female with a history of seizures and migraine headaches.  She returns today for follow-up.  She remains on Keppra 750 mg twice a day.  Denies any seizure events.  Her Topamax was increased to 75 mg.  She reports that her headaches have improved.  HISTORYMs. Alvarado is a 40 year old female with a history of seizures and headaches.  She returns today for follow-up.  She remains on Keppra and is tolerating it well.  She denies any seizure events.  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  She reports that her headaches are manageable.  She states that she continues to have that sharp shooting pain in the head that lasts for several seconds at least every other day.  But she does not wish to be on any medication at this time.   She returns today for evaluation.     REVIEW OF SYSTEMS: Out of a complete 14 system review of symptoms, the patient complains only of the following symptoms, and all other reviewed systems are negative.  See HPI  ALLERGIES: Allergies  Allergen Reactions  . Lamictal [Lamotrigine] Other (See Comments)    Made her feel crazy    HOME MEDICATIONS: Outpatient Medications Prior to Visit  Medication Sig Dispense Refill  . folic acid (FOLVITE) 1 MG tablet TAKE 1 TABLET(1 MG)  BY MOUTH DAILY 30 tablet 11  . levETIRAcetam (KEPPRA) 750 MG tablet TAKE 1 TABLET(750 MG) BY MOUTH TWICE DAILY 60 tablet 11  . topiramate (TOPAMAX) 25 MG tablet TAKE 3 TABLETS(75 MG) BY MOUTH DAILY 90 tablet 6   No facility-administered medications prior to visit.    PAST MEDICAL HISTORY: Past Medical History:  Diagnosis Date  . HSV-1 (herpes simplex virus 1) infection   . Migraine headache   . Seizure disorder ([place])     PAST SURGICAL HISTORY: Past Surgical History:  Procedure Laterality Date  . NO PAST SURGERIES      FAMILY HISTORY: Family History  Problem Relation Age of Onset  . Hypertension Maternal Grandmother   . Heart disease Maternal Grandmother   . Cancer Cousin 21       DIED WITH LIVER/LUNG CANCER  . Hypertension Maternal Aunt   . Seizures Maternal Aunt     SOCIAL HISTORY: Social History   Socioeconomic History  . Marital status: Married    Spouse name: Not on file  . Number of children: 2  . Years of education: college  . Highest education level: Not on file  Occupational History    Employer: [employer]  Tobacco Use  . Smoking status: Never Smoker  . Smokeless tobacco: Never Used  Vaping Use  . Vaping Use: Never used  Substance and Sexual Activity  . Alcohol use: Yes  Alcohol/week: 0.0 standard drinks    Comment: occ  . Drug use: No  . Sexual activity: Yes    Partners: Male    Birth control/protection: None    Comment: INTERCOURSE AGE 59, EXUAL PARTNERS LESS TRHAN 5  Other Topics Concern  . Not on file  Social History Narrative   Lives at home with her husband and children   Right handed   Caffeine: 1 cup/day usually   Social Determinants of Health   Financial Resource Strain: Not on file  Food Insecurity: Not on file  Transportation Needs: Not on file  Physical Activity: Not on file  Stress: Not on file  Social Connections: Not on file  Intimate Partner Violence: Not on file      PHYSICAL EXAM  Vitals:   07/16/20 0755   BP: 110/70  Pulse: 66  Weight: 151 lb (68.5 kg)  Height: 5' (1.524 m)   Body mass index is 29.49 kg/m.  Generalized: Well developed, in no acute distress   Neurological examination  Mentation: Alert oriented to time, place, history taking. Follows all commands speech and language fluent Cranial nerve II-XII: Pupils were equal round reactive to light. Extraocular movements were full, visual field were full on confrontational test. Head turning and shoulder shrug  were normal and symmetric. Motor: The motor testing reveals 5 over 5 strength of all 4 extremities. Good symmetric motor tone is noted throughout.  Sensory: Sensory testing is intact to soft touch on all 4 extremities. No evidence of extinction is noted.  Coordination: Cerebellar testing reveals good finger-nose-finger and heel-to-shin bilaterally.  Gait and station: Gait is normal.   Reflexes: Deep tendon reflexes are symmetric and normal bilaterally.   DIAGNOSTIC DATA (LABS, IMAGING, TESTING) - I reviewed patient records, labs, notes, testing and imaging myself where available.  Lab Results  Component Value Date   WBC 17.0 (H) 02/10/2019   HGB 13.1 02/10/2019   HCT 38.3 02/10/2019   MCV 88.9 02/10/2019   PLT 176 02/10/2019      Component Value Date/Time   NA 139 02/25/2014 1545   K 4.2 02/25/2014 1545   CL 105 02/25/2014 1545   CO2 20 02/25/2014 1545   GLUCOSE 86 02/25/2014 1545   BUN 8 02/25/2014 1545   CREATININE 0.66 02/25/2014 1545   CALCIUM 9.0 02/25/2014 1545   PROT 7.8 02/25/2014 1545   ALBUMIN 4.3 02/25/2014 1545   AST 24 02/25/2014 1545   ALT 9 02/25/2014 1545   ALKPHOS 73 02/25/2014 1545   BILITOT 0.5 02/25/2014 1545   GFRNONAA >90 02/25/2014 1545   GFRAA >90 02/25/2014 1545   Lab Results  Component Value Date   CHOL 139 06/29/2012   HDL 56 06/29/2012   LDLCALC 73 06/29/2012   TRIG 50 06/29/2012   CHOLHDL 2.5 06/29/2012   No results found for: HGBA1C No results found for: VITAMINB12 No  results found for: TSH    ASSESSMENT AND PLAN 40 y.o. year old female  has a past medical history of HSV-1 (herpes simplex virus 1) infection, Migraine headache, and Seizure disorder ([place]). here with:  1.  Migraine headache 2.  Primary stabbing headache  -Continue Topamax 75 mg daily -Discussed melatonin 3 5 mg at bedtime for primary stabbing headache per up-to-date has been found effective in some clinical cases. -Discussed adding indomethacin in the future  2.  Seizures  -Continue Keppra 750 mg twice a day  Advised if her symptoms worsen or she develops new symptoms she should let us  know.  She will follow-up in 1 year or sooner if needed   I spent 30 minutes of face-to-face and non-face-to-face time with patient.  This included previsit chart review, lab review, study review, order entry, electronic health record documentation, patient education.  Ward Givens, MSN, NP-C 07/16/2020, 8:06 AM [facility] [address] [place], [place] [address] [phone]

## 2020-08-19 ENCOUNTER — Other Ambulatory Visit: Payer: Self-pay | Admitting: Adult Health

## 2021-06-24 ENCOUNTER — Other Ambulatory Visit: Payer: Self-pay | Admitting: Adult Health

## 2021-07-20 ENCOUNTER — Ambulatory Visit: Payer: BC Managed Care – PPO | Admitting: Adult Health

## 2021-07-20 ENCOUNTER — Encounter: Payer: Self-pay | Admitting: Adult Health

## 2021-07-20 VITALS — BP 119/80 | HR 71 | Ht 60.0 in | Wt 154.4 lb

## 2021-07-20 DIAGNOSIS — G4485 Primary stabbing headache: Secondary | ICD-10-CM | POA: Diagnosis not present

## 2021-07-20 DIAGNOSIS — R569 Unspecified convulsions: Secondary | ICD-10-CM

## 2021-07-20 DIAGNOSIS — G43709 Chronic migraine without aura, not intractable, without status migrainosus: Secondary | ICD-10-CM | POA: Diagnosis not present

## 2021-07-20 MED ORDER — INDOMETHACIN 25 MG PO CAPS: 25.0000 mg | ORAL_CAPSULE | Freq: Every day | ORAL | 3 refills | Status: DC

## 2021-07-20 NOTE — Progress Notes (Signed)
? ? ?PATIENT: Lori Alvarado ?DOB: Sep 13, 1980 ? ?REASON FOR VISIT: follow up ?HISTORY FROM: patient ? ?HISTORY OF PRESENT ILLNESS: ?Today 07/20/21: ? ?Lori Alvarado is a 41 year old female with a history of seizures and migraine headaches.  She returns today for follow-up.  She reports that she has not had any seizure events.  She remains on Keppra 7 and 50 mg twice a day.  She reports that she got tinted windows and that has helped her in regards to the flashing lights. ? ?In regards to her headaches she still has stabbing headache at least once daily.  Typically occurring on the right side of the head.  She has had 2 episodes that lasted 20 minutes.  But typically they only last seconds.  She has been on Topamax 75 mg twice a day but has not noticed a significant benefit.  At the last visit we discussed indomethacin but she deferred.  She is willing to try indomethacin now.  Reports that she is not trying to get pregnant. ? ?07/16/20: Lori Alvarado is a 41 year old female with a history of seizures and migraine headaches.  She returns today for follow-up.  She remains on Keppra 750 mg twice a day.  Denies any seizure events.  She remains on Topamax 75 mg twice a day.  She states that she continues to get daily ice pick headaches.  She reports that they can occur in random location.  Only lasts for several seconds.  She denies any other symptoms.  Denies having a runny nose or watery eyes during these events. ? ?07/18/19: Lori Alvarado is a 41 year old female with a history of seizures and migraine headaches.  She returns today for follow-up.  She remains on Keppra 750 mg twice a day.  Denies any seizure events.  Her Topamax was increased to 75 mg.  She reports that her headaches have improved. ?  ?HISTORYMs. Alvarado is a 41 year old female with a history of seizures and headaches.  She returns today for follow-up.  She remains on Keppra and is tolerating it well.  She denies any seizure events.  She operates a  Teacher, music.  She is able to complete all ADLs independently.  She reports that her headaches are manageable.  She states that she continues to have that sharp shooting pain in the head that lasts for several seconds at least every other day.  But she does not wish to be on any medication at this time.   She returns today for evaluation. ?  ?  ? ?REVIEW OF SYSTEMS: Out of a complete 14 system review of symptoms, the patient complains only of the following symptoms, and all other reviewed systems are negative. ? ?See HPI ? ?ALLERGIES: ?Allergies  ?Allergen Reactions  ? Lamictal [Lamotrigine] Other (See Comments)  ?  Made her feel crazy  ? ? ?HOME MEDICATIONS: ?Outpatient Medications Prior to Visit  ?Medication Sig Dispense Refill  ? folic acid (FOLVITE) 1 MG tablet TAKE 1 TABLET(1 MG) BY MOUTH DAILY 30 tablet 11  ? levETIRAcetam (KEPPRA) 750 MG tablet TAKE 1 TABLET(750 MG) BY MOUTH TWICE DAILY 180 tablet 0  ? topiramate (TOPAMAX) 25 MG tablet TAKE 3 TABLETS(75 MG) BY MOUTH DAILY 270 tablet 3  ? ?No facility-administered medications prior to visit.  ? ? ?PAST MEDICAL HISTORY: ?Past Medical History:  ?Diagnosis Date  ? HSV-1 (herpes simplex virus 1) infection   ? Migraine headache   ? Seizure disorder ([place])   ? ? ?PAST SURGICAL HISTORY: ?Past Surgical  History:  ?Procedure Laterality Date  ? NO PAST SURGERIES    ? ? ?FAMILY HISTORY: ?Family History  ?Problem Relation Age of Onset  ? Hypertension Maternal Grandmother   ? Heart disease Maternal Grandmother   ? Cancer Cousin 21  ?     DIED WITH LIVER/LUNG CANCER  ? Hypertension Maternal Aunt   ? Seizures Maternal Aunt   ? ? ?SOCIAL HISTORY: ?Social History  ? ?Socioeconomic History  ? Marital status: Married  ?  Spouse name: Not on file  ? Number of children: 2  ? Years of education: college  ? Highest education level: Not on file  ?Occupational History  ?  Employer: [employer]  ?Tobacco Use  ? Smoking status: Never  ? Smokeless tobacco: Never  ?Vaping Use  ?  Vaping Use: Never used  ?Substance and Sexual Activity  ? Alcohol use: Yes  ?  Alcohol/week: 0.0 standard drinks  ?  Comment: occ  ? Drug use: No  ? Sexual activity: Yes  ?  Partners: Male  ?  Birth control/protection: None  ?  Comment: INTERCOURSE AGE 30, EXUAL PARTNERS LESS TRHAN 5  ?Other Topics Concern  ? Not on file  ?Social History Narrative  ? Lives at home with her husband and children  ? Right handed  ? Caffeine: 1 cup/day usually  ? ?Social Determinants of Health  ? ?Financial Resource Strain: Not on file  ?Food Insecurity: Not on file  ?Transportation Needs: Not on file  ?Physical Activity: Not on file  ?Stress: Not on file  ?Social Connections: Not on file  ?Intimate Partner Violence: Not on file  ? ? ? ? ?PHYSICAL EXAM ? ?Vitals:  ? 07/20/21 0801  ?BP: 119/80  ?Pulse: 71  ?Weight: 154 lb 6.4 oz (70 kg)  ?Height: 5' (1.524 m)  ? ? ?Body mass index is 30.15 kg/m?. ? ?Generalized: Well developed, in no acute distress  ? ?Neurological examination  ?Mentation: Alert oriented to time, place, history taking. Follows all commands speech and language fluent ?Cranial nerve II-XII: Pupils were equal round reactive to light. Extraocular movements were full, visual field were full on confrontational test. Head turning and shoulder shrug  were normal and symmetric. ?Motor: The motor testing reveals 5 over 5 strength of all 4 extremities. Good symmetric motor tone is noted throughout.  ?Sensory: Sensory testing is intact to soft touch on all 4 extremities. No evidence of extinction is noted.  ?Coordination: Cerebellar testing reveals good finger-nose-finger and heel-to-shin bilaterally.  ?Gait and station: Gait is normal.   ?Reflexes: Deep tendon reflexes are symmetric and normal bilaterally.  ? ?DIAGNOSTIC DATA (LABS, IMAGING, TESTING) ?- I reviewed patient records, labs, notes, testing and imaging myself where available. ? ?Lab Results  ?Component Value Date  ? WBC 17.0 (H) 02/10/2019  ? HGB 13.1 02/10/2019  ? HCT  38.3 02/10/2019  ? MCV 88.9 02/10/2019  ? PLT 176 02/10/2019  ? ?   ?Component Value Date/Time  ? NA 139 02/25/2014 1545  ? K 4.2 02/25/2014 1545  ? CL 105 02/25/2014 1545  ? CO2 20 02/25/2014 1545  ? GLUCOSE 86 02/25/2014 1545  ? BUN 8 02/25/2014 1545  ? CREATININE 0.66 02/25/2014 1545  ? CALCIUM 9.0 02/25/2014 1545  ? PROT 7.8 02/25/2014 1545  ? ALBUMIN 4.3 02/25/2014 1545  ? AST 24 02/25/2014 1545  ? ALT 9 02/25/2014 1545  ? ALKPHOS 73 02/25/2014 1545  ? BILITOT 0.5 02/25/2014 1545  ? GFRNONAA >90 02/25/2014 1545  ?  GFRAA >90 02/25/2014 1545  ? ?Lab Results  ?Component Value Date  ? CHOL 139 06/29/2012  ? HDL 56 06/29/2012  ? [place] 73 06/29/2012  ? TRIG 50 06/29/2012  ? CHOLHDL 2.5 06/29/2012  ? ?No results found for: HGBA1C ?No results found for: VITAMINB12 ?No results found for: TSH ? ? ? ?ASSESSMENT AND PLAN ?41 y.o. year old female  has a past medical history of HSV-1 (herpes simplex virus 1) infection, Migraine headache, and Seizure disorder ([place]). here with: ? ? ?2.  Primary stabbing headache ? ?-Continue Topamax 75 mg daily ?-Start indomethacin 25 mg daily can increase the dose by 25 mg daily and to you get to 75 mg.  Advised patient that if this does not offer her any benefit she should let us know ?-We will complete MRI of the brain to rule out any other causes of her headaches ? ? ?2.  Seizures ? ?-Continue Keppra 750 mg twice a day ? ?Advised if her symptoms worsen or she develops new symptoms she should let us know.  She will follow-up in 6 months or sooner if needed ? ? ?I spent 30 minutes of face-to-face and non-face-to-face time with patient.  This included previsit chart review, lab review, study review, order entry, electronic health record documentation, patient education. ? ?Ward Givens, MSN, NP-C 07/20/2021, 7:57 AM ?[facility] ?[address], [address] ?[place], [place] [address] ?([phone] ? ? ?

## 2021-07-20 NOTE — Patient Instructions (Addendum)
Your Plan: ? ?Continue Keppra 750 mg twice a day ?Start Indomethacin 25 mg daily for headaches. If no improvement can increase dose by 25 mg daily until you get to 75 mg daily.  ?MRI brain  ?If your symptoms worsen or you develop new symptoms please let us know.  ? ? ?Thank you for coming to see [place] at [facility]. I hope we have been able to provide you high quality care today. ? ?You may receive a patient satisfaction survey over the next few weeks. We would appreciate your feedback and comments so that we may continue to improve ourselves and the health of our patients. ? ?

## 2021-08-02 ENCOUNTER — Telehealth: Payer: Self-pay | Admitting: Adult Health

## 2021-08-02 NOTE — Telephone Encounter (Signed)
MR Brain w/wo contrast [facility] Josem Kaufmann: [id number] (exp. 07/28/21 to 08/26/21). Patient is scheduled at [facility] for [facility]/17/21. ? ?She also informed me she is claustrophobic and would like something to help her. She is aware to have a driver.   ?

## 2021-08-05 MED ORDER — ALPRAZOLAM 0.5 MG PO TABS: ORAL_TABLET | ORAL | 0 refills | Status: AC

## 2021-08-05 NOTE — Telephone Encounter (Signed)
Make sure patient knows she will need a driver ?

## 2021-08-05 NOTE — Addendum Note (Signed)
Addended by: Trudie Buckler on: 08/05/2021 08:48 AM ? ? Modules accepted: Orders, Level of Service ? ?

## 2021-08-17 ENCOUNTER — Other Ambulatory Visit: Payer: BC Managed Care – PPO

## 2021-08-30 NOTE — Telephone Encounter (Signed)
Updated Dillon Bjork: [id number] (exp. 08/30/21 to 09/28/21)  ?

## 2021-08-31 ENCOUNTER — Ambulatory Visit: Payer: BC Managed Care – PPO

## 2021-08-31 DIAGNOSIS — G4485 Primary stabbing headache: Secondary | ICD-10-CM

## 2021-08-31 DIAGNOSIS — G43709 Chronic migraine without aura, not intractable, without status migrainosus: Secondary | ICD-10-CM

## 2021-08-31 MED ORDER — GADOBENATE DIMEGLUMINE 529 MG/ML IV SOLN
15.0000 mL | Freq: Once | INTRAVENOUS | Status: AC | PRN
  Administered 2021-08-31: 15 mL via INTRAVENOUS

## 2021-09-22 ENCOUNTER — Other Ambulatory Visit: Payer: Self-pay | Admitting: Adult Health

## 2022-01-25 ENCOUNTER — Ambulatory Visit: Payer: BC Managed Care – PPO | Admitting: Adult Health

## 2022-02-04 ENCOUNTER — Other Ambulatory Visit: Payer: Self-pay | Admitting: Adult Health

## 2022-02-07 ENCOUNTER — Telehealth: Payer: Self-pay | Admitting: Adult Health

## 2022-02-07 MED ORDER — LEVETIRACETAM 750 MG PO TABS: 750.0000 mg | ORAL_TABLET | Freq: Two times a day (BID) | ORAL | 1 refills | Status: DC

## 2022-02-07 NOTE — Addendum Note (Signed)
Addended by: Gildardo Griffes on: 02/07/2022 11:34 AM   Modules accepted: Orders

## 2022-02-07 NOTE — Telephone Encounter (Signed)
That's fine

## 2022-02-07 NOTE — Telephone Encounter (Signed)
Refill sent. We had received this request in the refill queue.

## 2022-02-07 NOTE — Telephone Encounter (Signed)
Pt is calling. Stated she needs refill for levETIRAcetam (KEPPRA) 750 MG tablet sent to the pharmacy today. Pt said she is out of medication.

## 2022-05-03 ENCOUNTER — Telehealth: Payer: Self-pay | Admitting: Adult Health

## 2022-05-03 MED ORDER — LEVETIRACETAM 750 MG PO TABS: 750.0000 mg | ORAL_TABLET | Freq: Two times a day (BID) | ORAL | 0 refills | Status: DC

## 2022-05-03 MED ORDER — FOLIC ACID 1 MG PO TABS
ORAL_TABLET | ORAL | 0 refills | Status: DC
Start: 2022-05-03 — End: 2022-10-06

## 2022-05-03 NOTE — Telephone Encounter (Signed)
Pt is requesting a refill for levETIRAcetam (KEPPRA) 411 MG tablet & folic acid (FOLVITE) 1 MG tablet (90 day request for both). Pt confirmed no changes to insurance with [place]: [place] [phone]

## 2022-05-03 NOTE — Addendum Note (Signed)
Addended by: Gildardo Griffes on: 05/03/2022 10:58 AM   Modules accepted: Orders

## 2022-05-03 NOTE — Telephone Encounter (Signed)
90 day supply x 1 of Keppra 119 mg and Folic Acid 1 mg sent to [facility] #[id number].

## 2022-07-25 ENCOUNTER — Other Ambulatory Visit: Payer: Self-pay | Admitting: Adult Health

## 2022-08-10 ENCOUNTER — Ambulatory Visit: Payer: BC Managed Care – PPO | Admitting: Adult Health

## 2022-08-10 ENCOUNTER — Encounter: Payer: Self-pay | Admitting: Adult Health

## 2022-08-10 VITALS — BP 126/70 | HR 72 | Ht 60.0 in | Wt 155.0 lb

## 2022-08-10 DIAGNOSIS — R569 Unspecified convulsions: Secondary | ICD-10-CM

## 2022-08-10 DIAGNOSIS — G4485 Primary stabbing headache: Secondary | ICD-10-CM

## 2022-08-10 MED ORDER — TOPIRAMATE 25 MG PO TABS: 50.0000 mg | ORAL_TABLET | Freq: Every day | ORAL | 3 refills | Status: DC

## 2022-08-10 NOTE — Progress Notes (Signed)
PATIENT: Lori Alvarado DOB: 05-Jun-1980  REASON FOR VISIT: follow up HISTORY FROM: patient  Chief Complaint  Patient presents with   Follow-up    Pt in 5 Here for seizures f/u pt states no seizures  in 11 years Pt states no questions or concerns for this visit      HISTORY OF PRESENT ILLNESS: Today 08/10/22:  Lori Alvarado is a 42 y.o. female with a history of Seizures and Migraines. Returns today for follow-up.  Reports no seizures.  Remains on Keppra 750 mg twice a day.  She states that her headaches have improved slightly.  Currently taking 50 mg at bedtime.  She states that they are not frequent enough that she keeps up with how often they are happening.  Returns today for an evaluation.    50 mg BID Lori Alvarado is a 42 year old female with a history of seizures and migraine headaches.  She returns today for follow-up.  She reports that she has not had any seizure events.  She remains on Keppra 7 and 50 mg twice a day.  She reports that she got tinted windows and that has helped her in regards to the flashing lights.  In regards to her headaches she still has stabbing headache at least once daily.  Typically occurring on the right side of the head.  She has had 2 episodes that lasted 20 minutes.  But typically they only last seconds.  She has been on Topamax 75 mg twice a day but has not noticed a significant benefit.  At the last visit we discussed indomethacin but she deferred.  She is willing to try indomethacin now.  Reports that she is not trying to get pregnant.  07/16/20: Lori Alvarado is a 42 year old female with a history of seizures and migraine headaches.  She returns today for follow-up.  She remains on Keppra 750 mg twice a day.  Denies any seizure events.  She remains on Topamax 75 mg twice a day.  She states that she continues to get daily ice pick headaches.  She reports that they can occur in random location.  Only lasts for several seconds.  She denies any  other symptoms.  Denies having a runny nose or watery eyes during these events.  07/18/19: Lori Alvarado is a 42 year old female with a history of seizures and migraine headaches.  She returns today for follow-up.  She remains on Keppra 750 mg twice a day.  Denies any seizure events.  Her Topamax was increased to 75 mg.  She reports that her headaches have improved.   HISTORYMs. Alvarado is a 42 year old female with a history of seizures and headaches.  She returns today for follow-up.  She remains on Keppra and is tolerating it well.  She denies any seizure events.  She operates a Librarian, academic.  She is able to complete all ADLs independently.  She reports that her headaches are manageable.  She states that she continues to have that sharp shooting pain in the head that lasts for several seconds at least every other day.  But she does not wish to be on any medication at this time.   She returns today for evaluation.      REVIEW OF SYSTEMS: Out of a complete 14 system review of symptoms, the patient complains only of the following symptoms, and all other reviewed systems are negative.  See HPI  ALLERGIES: Allergies  Allergen Reactions   Lamictal [Lamotrigine] Other (See Comments)  Made her feel crazy    HOME MEDICATIONS: Outpatient Medications Prior to Visit  Medication Sig Dispense Refill   folic acid (FOLVITE) 1 MG tablet TAKE 1 TABLET(1 MG) BY MOUTH DAILY 90 tablet 0   levETIRAcetam (KEPPRA) 750 MG tablet TAKE 1 TABLET(750 MG) BY MOUTH TWICE DAILY 60 tablet 0   topiramate (TOPAMAX) 25 MG tablet TAKE 3 TABLETS(75 MG) BY MOUTH DAILY 270 tablet 3   VITAMIN D PO Take by mouth.     VITAMIN E PO Take by mouth.     ALPRAZolam (XANAX) 0.5 MG tablet Take 1 tablet 30 minutes before scan. Take an additional 1 tablet at the time of scan. Needs a driver. 2 tablet 0   Ascorbic Acid (VITAMIN C PO) Take by mouth.     indomethacin (INDOCIN) 25 MG capsule Take 1 capsule (25 mg total) by mouth daily.  (Patient not taking: Reported on 08/10/2022) 90 capsule 3   No facility-administered medications prior to visit.    PAST MEDICAL HISTORY: Past Medical History:  Diagnosis Date   HSV-1 (herpes simplex virus 1) infection    Migraine headache    Seizure disorder     PAST SURGICAL HISTORY: Past Surgical History:  Procedure Laterality Date   NO PAST SURGERIES      FAMILY HISTORY: Family History  Problem Relation Age of Onset   Hypertension Maternal Grandmother    Heart disease Maternal Grandmother    Cancer Cousin [id number]       DIED WITH LIVER/LUNG CANCER   Hypertension Maternal Aunt    Seizures Maternal Aunt     SOCIAL HISTORY: Social History   Socioeconomic History   Marital status: Married    Spouse name: Not on file   Number of children: 2   Years of education: college   Highest education level: Not on file  Occupational History    Employer: [employer]  Tobacco Use   Smoking status: Never   Smokeless tobacco: Never  Vaping Use   Vaping Use: Never used  Substance and Sexual Activity   Alcohol use: Yes    Alcohol/week: 2.0 standard drinks of alcohol    Types: 2 Glasses of wine per week    Comment: occ   Drug use: No   Sexual activity: Yes    Partners: Male    Birth control/protection: None    Comment: INTERCOURSE AGE 59, EXUAL PARTNERS LESS TRHAN 5  Other Topics Concern   Not on file  Social History Narrative   Lives at home with her husband and children   Right handed   Caffeine: 1 cup/day usually   Social Determinants of Corporate investment bankerHealth   Financial Resource Strain: Not on file  Food Insecurity: Not on file  Transportation Needs: Not on file  Physical Activity: Not on file  Stress: Not on file  Social Connections: Not on file  Intimate Partner Violence: Not on file      PHYSICAL EXAM  Vitals:   08/10/22 1056  BP: 126/70  Pulse: 72  Weight: 155 lb (70.3 kg)  Height: 5' (1.524 m)    Body mass index is 30.27 kg/m.  Generalized: Well  developed, in no acute distress   Neurological examination  Mentation: Alert oriented to time, place, history taking. Follows all commands speech and language fluent Cranial nerve II-XII: Pupils were equal round reactive to light. Extraocular movements were full, visual field were full on confrontational test. Head turning and shoulder shrug  were normal and symmetric. Motor: The motor testing  reveals 5 over 5 strength of all 4 extremities. Good symmetric motor tone is noted throughout.  Sensory: Sensory testing is intact to soft touch on all 4 extremities. No evidence of extinction is noted.  Coordination: Cerebellar testing reveals good finger-nose-finger and heel-to-shin bilaterally.  Gait and station: Gait is normal.   Reflexes: Deep tendon reflexes are symmetric and normal bilaterally.   DIAGNOSTIC DATA (LABS, IMAGING, TESTING) - I reviewed patient records, labs, notes, testing and imaging myself where available.  Lab Results  Component Value Date   WBC 17.0 (H) 02/10/2019   HGB 13.1 02/10/2019   HCT 38.3 02/10/2019   MCV 88.9 02/10/2019   PLT 176 02/10/2019      Component Value Date/Time   NA 139 02/25/2014 1545   K 4.2 02/25/2014 1545   CL 105 02/25/2014 1545   CO2 20 02/25/2014 1545   GLUCOSE 86 02/25/2014 1545   BUN 8 02/25/2014 1545   CREATININE 0.66 02/25/2014 1545   CALCIUM 9.0 02/25/2014 1545   PROT 7.8 02/25/2014 1545   ALBUMIN 4.3 02/25/2014 1545   AST 24 02/25/2014 1545   ALT 9 02/25/2014 1545   ALKPHOS 73 02/25/2014 1545   BILITOT 0.5 02/25/2014 1545   GFRNONAA >90 02/25/2014 1545   GFRAA >90 02/25/2014 1545   Lab Results  Component Value Date   CHOL 139 06/29/2012   HDL 56 06/29/2012   LDLCALC 73 06/29/2012   TRIG 50 06/29/2012   CHOLHDL 2.5 06/29/2012      ASSESSMENT AND PLAN 42 y.o. year old female  has a past medical history of HSV-1 (herpes simplex virus 1) infection, Migraine headache, and Seizure disorder. here with:   1.  Primary  stabbing headache  -Continue Topamax 50 mg daily   2.  Seizures  -Continue Keppra 750 mg twice a day  Advised if her symptoms worsen or she develops new symptoms she should let us know.  She will follow-up in 1 year or sooner if needed   Butch Penny, MSN, NP-C 08/10/2022, 11:13 AM [facility] [address], [address] [place], [place] [address] [phone]

## 2022-08-10 NOTE — Patient Instructions (Signed)
Your Plan: ? ?Continue ? ? ? ? ?Thank you for coming to see us at [facility]. I hope we have been able to provide you high quality care today. ? ?You may receive a patient satisfaction survey over the next few weeks. We would appreciate your feedback and comments so that we may continue to improve ourselves and the health of our patients. ? ?

## 2022-09-12 ENCOUNTER — Encounter: Payer: Self-pay | Admitting: Adult Health

## 2022-09-12 NOTE — Telephone Encounter (Signed)
No drug-drug interactions found on Micromedex between Topamax, Keppra, and Phentermine

## 2022-09-14 NOTE — Telephone Encounter (Signed)
Megan NP said the following: Phentermine reported up-to-date states to use with caution and seizure disorders.  The patient has not had a seizure in quite some time and  she is on 2 agents.  Just let her know about this caution.  Info reported to patient

## 2022-10-06 ENCOUNTER — Other Ambulatory Visit: Payer: Self-pay | Admitting: Adult Health

## 2022-11-05 ENCOUNTER — Other Ambulatory Visit: Payer: Self-pay | Admitting: Adult Health

## 2022-12-05 ENCOUNTER — Other Ambulatory Visit: Payer: Self-pay | Admitting: Adult Health

## 2022-12-06 NOTE — Telephone Encounter (Signed)
If she isn't trying to conceive then she doesn't have to continue

## 2022-12-19 ENCOUNTER — Other Ambulatory Visit: Payer: Self-pay | Admitting: Adult Health

## 2022-12-21 NOTE — Telephone Encounter (Signed)
Please clarify with patient. I had 50 mg in my last note

## 2022-12-22 ENCOUNTER — Other Ambulatory Visit: Payer: Self-pay | Admitting: Adult Health

## 2022-12-22 MED ORDER — FOLIC ACID 1 MG PO TABS: ORAL_TABLET | ORAL | 0 refills | Status: DC

## 2022-12-22 NOTE — Telephone Encounter (Signed)
Pt called back and clarified that she takes 50 mg a day.

## 2023-01-29 ENCOUNTER — Other Ambulatory Visit: Payer: Self-pay | Admitting: Adult Health

## 2023-01-30 ENCOUNTER — Other Ambulatory Visit: Payer: Self-pay | Admitting: Adult Health

## 2023-02-01 NOTE — Telephone Encounter (Signed)
Can we call patient about this.  She was originally instructed take folic acid because she was trying to conceive.  However it was my understanding that she is not trying to conceive.

## 2023-02-02 NOTE — Telephone Encounter (Signed)
I called pt and she is not trying to conceive.  She stated has been taking due to being on antiseizure medications (when Dr. Anne Hahn put her on it).  She has been taking for 12 yrs.

## 2023-08-01 ENCOUNTER — Telehealth: Payer: BC Managed Care – PPO | Admitting: Adult Health

## 2023-08-01 DIAGNOSIS — G4485 Primary stabbing headache: Secondary | ICD-10-CM | POA: Diagnosis not present

## 2023-08-01 DIAGNOSIS — R569 Unspecified convulsions: Secondary | ICD-10-CM

## 2023-08-01 NOTE — Patient Instructions (Signed)
 Your Plan:  Continue Keppra 750 mg twice a day Continue Topamax 50 mg daily     Thank you for coming to see [place] at [facility]. I hope we have been able to provide you high quality care today.  You may receive a patient satisfaction survey over the next few weeks. We would appreciate your feedback and comments so that we may continue to improve ourselves and the health of our patients.

## 2023-08-01 NOTE — Progress Notes (Addendum)
 PATIENT: Lori Alvarado DOB: 1980/12/07  REASON FOR VISIT: follow up HISTORY FROM: patient    Virtual Visit via Video Note  I connected with Lori Alvarado on 08/01/23 at  1:15 PM EDT by a video enabled telemedicine application located remotely at [facility] and verified that I am speaking with the correct person using two identifiers who was located at work.    I discussed the limitations of evaluation and management by telemedicine and the availability of in person appointments. The patient expressed understanding and agreed to proceed.    HISTORY OF PRESENT ILLNESS: Today 08/01/23:  Lori Alvarado is a 43 y.o. female with a history of seizures and migraines. Returns today for follow-up.  Remains on Keppra 750 mg twice a day.  Denies any seizure events.  Tolerates the medication well.  No changes in her mood or behavior.  She was having stabbing headaches however those have subsided.  She has remained on Topamax 50 mg at bedtime   08/10/22:Lori Alvarado is a 43 y.o. female with a history of Seizures and Migraines. Returns today for follow-up.  Reports no seizures.  Remains on Keppra 750 mg twice a day.  She states that her headaches have improved slightly.  Currently taking 50 mg at bedtime.  She states that they are not frequent enough that she keeps up with how often they are happening.  Returns today for an evaluation.   07/20/2021: Lori Alvarado is a 43 year old female with a history of seizures and migraine headaches.  She returns today for follow-up.  She reports that she has not had any seizure events.  She remains on Keppra 7 and 50 mg twice a day.  She reports that she got tinted windows and that has helped her in regards to the flashing lights.  In regards to her headaches she still has stabbing headache at least once daily.  Typically occurring on the right side of the head.  She has had 2 episodes that lasted 20 minutes.  But typically they  only last seconds.  She has been on Topamax 75 mg twice a day but has not noticed a significant benefit.  At the last visit we discussed indomethacin but she deferred.  She is willing to try indomethacin now.  Reports that she is not trying to get pregnant.  07/16/20: Lori Alvarado is a 43 year old female with a history of seizures and migraine headaches.  She returns today for follow-up.  She remains on Keppra 750 mg twice a day.  Denies any seizure events.  She remains on Topamax 75 mg twice a day.  She states that she continues to get daily ice pick headaches.  She reports that they can occur in random location.  Only lasts for several seconds.  She denies any other symptoms.  Denies having a runny nose or watery eyes during these events.  07/18/19: Lori Alvarado is a 43 year old female with a history of seizures and migraine headaches.  She returns today for follow-up.  She remains on Keppra 750 mg twice a day.  Denies any seizure events.  Her Topamax was increased to 75 mg.  She reports that her headaches have improved.   HISTORYMs. Alvarado is a 43 year old female with a history of seizures and headaches.  She returns today for follow-up.  She remains on Keppra and is tolerating it well.  She denies any seizure events.  She operates a Librarian, academic.  She is able to complete all ADLs  independently.  She reports that her headaches are manageable.  She states that she continues to have that sharp shooting pain in the head that lasts for several seconds at least every other day.  But she does not wish to be on any medication at this time.   She returns today for evaluation.      REVIEW OF SYSTEMS: Out of a complete 14 system review of symptoms, the patient complains only of the following symptoms, and all other reviewed systems are negative.  See HPI  ALLERGIES: Allergies  Allergen Reactions   Lamictal [Lamotrigine] Other (See Comments)    Made her feel crazy    HOME MEDICATIONS: Outpatient  Medications Prior to Visit  Medication Sig Dispense Refill   ALPRAZolam (XANAX) 0.5 MG tablet Take 1 tablet 30 minutes before scan. Take an additional 1 tablet at the time of scan. Needs a driver. 2 tablet 0   Ascorbic Acid (VITAMIN C PO) Take by mouth.     folic acid (FOLVITE) 1 MG tablet TAKE 1 TABLET BY MOUTH DAILY 30 tablet 5   levETIRAcetam (KEPPRA) 750 MG tablet TAKE 1 TABLET(750 MG) BY MOUTH TWICE DAILY 180 tablet 2   topiramate (TOPAMAX) 25 MG tablet Take 2 tablets (50 mg total) by mouth daily. 180 tablet 3   VITAMIN D PO Take by mouth.     VITAMIN E PO Take by mouth.     No facility-administered medications prior to visit.    PAST MEDICAL HISTORY: Past Medical History:  Diagnosis Date   HSV-1 (herpes simplex virus 1) infection    Migraine headache    Seizure disorder (HCC)     PAST SURGICAL HISTORY: Past Surgical History:  Procedure Laterality Date   NO PAST SURGERIES      FAMILY HISTORY: Family History  Problem Relation Age of Onset   Hypertension Maternal Grandmother    Heart disease Maternal Grandmother    Cancer Cousin 6       DIED WITH LIVER/LUNG CANCER   Hypertension Maternal Aunt    Seizures Maternal Aunt     SOCIAL HISTORY: Social History   Socioeconomic History   Marital status: Married    Spouse name: Not on file   Number of children: 2   Years of education: college   Highest education level: Not on file  Occupational History    Employer: [employer]  Tobacco Use   Smoking status: Never   Smokeless tobacco: Never  Vaping Use   Vaping status: Never Used  Substance and Sexual Activity   Alcohol use: Yes    Alcohol/week: 2.0 standard drinks of alcohol    Types: 2 Glasses of wine per week    Comment: occ   Drug use: No   Sexual activity: Yes    Partners: Male    Birth control/protection: None    Comment: INTERCOURSE AGE 78, EXUAL PARTNERS LESS TRHAN 5  Other Topics Concern   Not on file  Social History Narrative   Lives at home  with her husband and children   Right handed   Caffeine: 1 cup/day usually   Social Drivers of Corporate investment banker Strain: Low Risk  (09/11/2022)   Received from [facility], [facility]   Overall Financial Resource Strain (CARDIA)    Difficulty of Paying Living Expenses: Not hard at all  Food Insecurity: No Food Insecurity (09/11/2022)   Received from [facility], [facility]   Hunger Vital Sign    Worried About Running Out  of Food in the Last Year: Never true    Ran Out of Food in the Last Year: Never true  Transportation Needs: No Transportation Needs (09/11/2022)   Received from [facility], [facility]   PRAPARE - Transportation    Lack of Transportation (Medical): No    Lack of Transportation (Non-Medical): No  Physical Activity: Sufficiently Active (09/11/2022)   Received from [facility], [facility]   Exercise Vital Sign    Days of Exercise per Week: 4 days    Minutes of Exercise per Session: 40 min  Stress: No Stress Concern Present (09/11/2022)   Received from [facility], [facility] - Occupational Stress Questionnaire    Feeling of Stress : Not at all  Social Connections: Socially Integrated (09/11/2022)   Received from [facility], [facility]   Social Network    How would you rate your social network (family, work, friends)?: Good participation with social networks  Intimate Partner Violence: Not At Risk (09/11/2022)   Received from [facility], [facility]   HITS    Over the last 12 months how often did your partner physically hurt you?: Never    Over the last 12 months how often did your partner insult you or talk down to you?: Never    Over the last 12 months how often did your partner threaten you with physical harm?: Never    Over the last 12 months how often did your partner scream or curse at you?: Never      PHYSICAL EXAM   Generalized: Well developed, in no acute distress    Neurological examination  Mentation: Alert oriented to time, place, history taking. Follows all commands speech and language fluent Cranial nerve II-XII: Pupils were equal round reactive to light. Extraocular movements were full, visual field were full on confrontational test. Head turning and shoulder shrug  were normal and symmetric. Motor: The motor testing reveals 5 over 5 strength of all 4 extremities. Good symmetric motor tone is noted throughout.  Sensory: Sensory testing is intact to soft touch on all 4 extremities. No evidence of extinction is noted.  Coordination: Cerebellar testing reveals good finger-nose-finger and heel-to-shin bilaterally.  Gait and station: Gait is normal.   Reflexes: Deep tendon reflexes are symmetric and normal bilaterally.   DIAGNOSTIC DATA (LABS, IMAGING, TESTING) - I reviewed patient records, labs, notes, testing and imaging myself where available.  Lab Results  Component Value Date   WBC 17.0 (H) 02/10/2019   HGB 13.1 02/10/2019   HCT 38.3 02/10/2019   MCV 88.9 02/10/2019   PLT 176 02/10/2019      Component Value Date/Time   NA 139 02/25/2014 1545   K 4.2 02/25/2014 1545   CL 105 02/25/2014 1545   CO2 20 02/25/2014 1545   GLUCOSE 86 02/25/2014 1545   BUN 8 02/25/2014 1545   CREATININE 0.66 02/25/2014 1545   CALCIUM 9.0 02/25/2014 1545   PROT 7.8 02/25/2014 1545   ALBUMIN 4.3 02/25/2014 1545   AST 24 02/25/2014 1545   ALT 9 02/25/2014 1545   ALKPHOS 73 02/25/2014 1545   BILITOT 0.5 02/25/2014 1545   GFRNONAA >90 02/25/2014 1545   GFRAA >90 02/25/2014 1545   Lab Results  Component Value Date   CHOL 139 06/29/2012   HDL 56 06/29/2012   LDLCALC 73 06/29/2012   TRIG 50 06/29/2012   CHOLHDL 2.5 06/29/2012      ASSESSMENT AND PLAN 43 y.o. year old  female  has a past medical history of HSV-1 (herpes simplex virus 1) infection, Migraine headache, and Seizure disorder (HCC). here with:   1.  Primary stabbing headache  -Continue  Topamax 50 mg daily  2.  Seizures  -Continue Keppra 750 mg twice a day  Advised if her symptoms worsen or she develops new symptoms she should let us know.  She will follow-up in 1 year or sooner if needed   Butch Penny, MSN, NP-C 08/01/2023, 1:19 PM [facility] [address], [address] [place], [place] [address] [phone]

## 2023-08-15 ENCOUNTER — Telehealth: Payer: BC Managed Care – PPO | Admitting: Adult Health

## 2023-10-23 ENCOUNTER — Other Ambulatory Visit: Payer: Self-pay | Admitting: Adult Health

## 2023-12-19 ENCOUNTER — Other Ambulatory Visit: Payer: Self-pay | Admitting: Adult Health

## 2024-08-01 ENCOUNTER — Telehealth: Admitting: Adult Health
